# Patient Record
Sex: Female | Born: 1960 | Race: Black or African American | Hispanic: No | Marital: Single | Smoking: Never smoker
Health system: Southern US, Community
[De-identification: ages and names within clinical notes are randomized; demographics above are authoritative.]

## PROBLEM LIST (undated history)

## (undated) DIAGNOSIS — K5090 Crohn's disease, unspecified, without complications: Secondary | ICD-10-CM

## (undated) DIAGNOSIS — J329 Chronic sinusitis, unspecified: Secondary | ICD-10-CM

## (undated) DIAGNOSIS — E739 Lactose intolerance, unspecified: Secondary | ICD-10-CM

## (undated) HISTORY — PX: BREAST REDUCTION SURGERY: SHX8

## (undated) HISTORY — PX: ABDOMINAL HYSTERECTOMY: SHX81

## (undated) HISTORY — PX: BREAST SURGERY: SHX581

---

## 2000-11-20 ENCOUNTER — Emergency Department (HOSPITAL_COMMUNITY): Admission: EM | Admit: 2000-11-20 | Discharge: 2000-11-20 | Payer: Self-pay | Admitting: Emergency Medicine

## 2001-01-29 ENCOUNTER — Emergency Department (HOSPITAL_COMMUNITY): Admission: EM | Admit: 2001-01-29 | Discharge: 2001-01-29 | Payer: Self-pay | Admitting: *Deleted

## 2001-10-09 ENCOUNTER — Emergency Department (HOSPITAL_COMMUNITY): Admission: EM | Admit: 2001-10-09 | Discharge: 2001-10-09 | Payer: Self-pay | Admitting: *Deleted

## 2001-10-22 ENCOUNTER — Emergency Department (HOSPITAL_COMMUNITY): Admission: EM | Admit: 2001-10-22 | Discharge: 2001-10-22 | Payer: Self-pay | Admitting: Emergency Medicine

## 2002-02-23 ENCOUNTER — Inpatient Hospital Stay (HOSPITAL_COMMUNITY): Admission: EM | Admit: 2002-02-23 | Discharge: 2002-02-25 | Payer: Self-pay | Admitting: Emergency Medicine

## 2002-02-23 ENCOUNTER — Encounter: Payer: Self-pay | Admitting: Emergency Medicine

## 2002-09-16 ENCOUNTER — Emergency Department (HOSPITAL_COMMUNITY): Admission: EM | Admit: 2002-09-16 | Discharge: 2002-09-17 | Payer: Self-pay | Admitting: Emergency Medicine

## 2002-09-16 ENCOUNTER — Encounter: Payer: Self-pay | Admitting: Family Medicine

## 2002-09-20 ENCOUNTER — Encounter: Payer: Self-pay | Admitting: *Deleted

## 2002-09-20 ENCOUNTER — Inpatient Hospital Stay (HOSPITAL_COMMUNITY): Admission: EM | Admit: 2002-09-20 | Discharge: 2002-09-23 | Payer: Self-pay | Admitting: Emergency Medicine

## 2002-12-28 ENCOUNTER — Encounter: Payer: Self-pay | Admitting: Internal Medicine

## 2002-12-29 ENCOUNTER — Inpatient Hospital Stay (HOSPITAL_COMMUNITY): Admission: AD | Admit: 2002-12-29 | Discharge: 2002-12-31 | Payer: Self-pay | Admitting: Internal Medicine

## 2003-01-28 ENCOUNTER — Encounter (INDEPENDENT_AMBULATORY_CARE_PROVIDER_SITE_OTHER): Payer: Self-pay | Admitting: Specialist

## 2003-01-28 ENCOUNTER — Inpatient Hospital Stay (HOSPITAL_COMMUNITY): Admission: RE | Admit: 2003-01-28 | Discharge: 2003-02-05 | Payer: Self-pay | Admitting: Gastroenterology

## 2003-01-31 ENCOUNTER — Encounter (INDEPENDENT_AMBULATORY_CARE_PROVIDER_SITE_OTHER): Payer: Self-pay | Admitting: *Deleted

## 2005-01-24 ENCOUNTER — Emergency Department (HOSPITAL_COMMUNITY): Admission: EM | Admit: 2005-01-24 | Discharge: 2005-01-24 | Payer: Self-pay | Admitting: Emergency Medicine

## 2005-04-14 ENCOUNTER — Inpatient Hospital Stay (HOSPITAL_COMMUNITY): Admission: AD | Admit: 2005-04-14 | Discharge: 2005-04-14 | Payer: Self-pay | Admitting: Obstetrics and Gynecology

## 2005-05-02 ENCOUNTER — Ambulatory Visit: Payer: Self-pay | Admitting: Obstetrics and Gynecology

## 2005-05-11 ENCOUNTER — Inpatient Hospital Stay (HOSPITAL_COMMUNITY): Admission: AD | Admit: 2005-05-11 | Discharge: 2005-05-11 | Payer: Self-pay | Admitting: Obstetrics and Gynecology

## 2005-05-12 ENCOUNTER — Ambulatory Visit: Payer: Self-pay | Admitting: *Deleted

## 2005-05-12 ENCOUNTER — Observation Stay (HOSPITAL_COMMUNITY): Admission: AD | Admit: 2005-05-12 | Discharge: 2005-05-13 | Payer: Self-pay | Admitting: Obstetrics and Gynecology

## 2005-06-27 ENCOUNTER — Ambulatory Visit: Payer: Self-pay | Admitting: Obstetrics and Gynecology

## 2005-07-01 ENCOUNTER — Ambulatory Visit: Payer: Self-pay | Admitting: Obstetrics and Gynecology

## 2005-07-01 ENCOUNTER — Encounter (INDEPENDENT_AMBULATORY_CARE_PROVIDER_SITE_OTHER): Payer: Self-pay | Admitting: *Deleted

## 2005-07-01 ENCOUNTER — Ambulatory Visit (HOSPITAL_COMMUNITY): Admission: RE | Admit: 2005-07-01 | Discharge: 2005-07-01 | Payer: Self-pay | Admitting: Obstetrics and Gynecology

## 2005-07-02 ENCOUNTER — Inpatient Hospital Stay (HOSPITAL_COMMUNITY): Admission: AD | Admit: 2005-07-02 | Discharge: 2005-07-02 | Payer: Self-pay | Admitting: *Deleted

## 2005-07-11 ENCOUNTER — Ambulatory Visit: Payer: Self-pay | Admitting: Obstetrics and Gynecology

## 2005-08-02 ENCOUNTER — Ambulatory Visit: Payer: Self-pay | Admitting: *Deleted

## 2005-10-17 ENCOUNTER — Ambulatory Visit: Payer: Self-pay | Admitting: Obstetrics and Gynecology

## 2005-11-01 ENCOUNTER — Emergency Department (HOSPITAL_COMMUNITY): Admission: EM | Admit: 2005-11-01 | Discharge: 2005-11-01 | Payer: Self-pay | Admitting: Emergency Medicine

## 2006-01-23 ENCOUNTER — Ambulatory Visit: Payer: Self-pay | Admitting: Obstetrics and Gynecology

## 2006-05-01 ENCOUNTER — Ambulatory Visit: Payer: Self-pay | Admitting: Obstetrics and Gynecology

## 2006-05-13 ENCOUNTER — Ambulatory Visit: Payer: Self-pay | Admitting: Obstetrics and Gynecology

## 2006-05-13 ENCOUNTER — Inpatient Hospital Stay (HOSPITAL_COMMUNITY): Admission: RE | Admit: 2006-05-13 | Discharge: 2006-05-16 | Payer: Self-pay | Admitting: Obstetrics and Gynecology

## 2006-05-13 ENCOUNTER — Encounter (INDEPENDENT_AMBULATORY_CARE_PROVIDER_SITE_OTHER): Payer: Self-pay | Admitting: Specialist

## 2006-05-16 ENCOUNTER — Ambulatory Visit (HOSPITAL_COMMUNITY): Admission: RE | Admit: 2006-05-16 | Discharge: 2006-05-16 | Payer: Self-pay | Admitting: Obstetrics and Gynecology

## 2006-05-28 ENCOUNTER — Ambulatory Visit: Payer: Self-pay | Admitting: Obstetrics and Gynecology

## 2006-06-05 ENCOUNTER — Encounter: Admission: RE | Admit: 2006-06-05 | Discharge: 2006-06-05 | Payer: Self-pay | Admitting: Obstetrics and Gynecology

## 2006-06-10 ENCOUNTER — Encounter: Admission: RE | Admit: 2006-06-10 | Discharge: 2006-06-10 | Payer: Self-pay | Admitting: Obstetrics and Gynecology

## 2006-06-10 ENCOUNTER — Encounter (INDEPENDENT_AMBULATORY_CARE_PROVIDER_SITE_OTHER): Payer: Self-pay | Admitting: Specialist

## 2006-07-17 ENCOUNTER — Emergency Department (HOSPITAL_COMMUNITY): Admission: EM | Admit: 2006-07-17 | Discharge: 2006-07-17 | Payer: Self-pay | Admitting: Emergency Medicine

## 2006-08-14 ENCOUNTER — Encounter (INDEPENDENT_AMBULATORY_CARE_PROVIDER_SITE_OTHER): Payer: Self-pay | Admitting: *Deleted

## 2006-08-14 ENCOUNTER — Encounter: Admission: RE | Admit: 2006-08-14 | Discharge: 2006-08-14 | Payer: Self-pay | Admitting: General Surgery

## 2006-08-14 ENCOUNTER — Ambulatory Visit (HOSPITAL_COMMUNITY): Admission: RE | Admit: 2006-08-14 | Discharge: 2006-08-14 | Payer: Self-pay | Admitting: General Surgery

## 2007-02-19 ENCOUNTER — Emergency Department (HOSPITAL_COMMUNITY): Admission: EM | Admit: 2007-02-19 | Discharge: 2007-02-19 | Payer: Self-pay | Admitting: Emergency Medicine

## 2008-05-30 ENCOUNTER — Encounter (INDEPENDENT_AMBULATORY_CARE_PROVIDER_SITE_OTHER): Payer: Self-pay | Admitting: Emergency Medicine

## 2008-05-30 ENCOUNTER — Ambulatory Visit: Payer: Self-pay | Admitting: *Deleted

## 2008-05-30 ENCOUNTER — Emergency Department (HOSPITAL_COMMUNITY): Admission: EM | Admit: 2008-05-30 | Discharge: 2008-05-30 | Payer: Self-pay | Admitting: Emergency Medicine

## 2008-08-19 ENCOUNTER — Emergency Department (HOSPITAL_COMMUNITY): Admission: EM | Admit: 2008-08-19 | Discharge: 2008-08-20 | Payer: Self-pay | Admitting: Emergency Medicine

## 2009-05-22 ENCOUNTER — Encounter: Admission: RE | Admit: 2009-05-22 | Discharge: 2009-05-22 | Payer: Self-pay | Admitting: Obstetrics and Gynecology

## 2009-09-13 ENCOUNTER — Inpatient Hospital Stay (HOSPITAL_COMMUNITY): Admission: AD | Admit: 2009-09-13 | Discharge: 2009-09-13 | Payer: Self-pay | Admitting: Obstetrics & Gynecology

## 2009-09-21 ENCOUNTER — Encounter: Payer: Self-pay | Admitting: *Deleted

## 2009-09-21 ENCOUNTER — Ambulatory Visit: Payer: Self-pay | Admitting: Family Medicine

## 2009-09-21 DIAGNOSIS — J309 Allergic rhinitis, unspecified: Secondary | ICD-10-CM | POA: Insufficient documentation

## 2009-09-21 DIAGNOSIS — K5090 Crohn's disease, unspecified, without complications: Secondary | ICD-10-CM | POA: Insufficient documentation

## 2009-09-21 DIAGNOSIS — N644 Mastodynia: Secondary | ICD-10-CM | POA: Insufficient documentation

## 2009-09-25 ENCOUNTER — Telehealth: Payer: Self-pay | Admitting: *Deleted

## 2010-02-20 ENCOUNTER — Ambulatory Visit (HOSPITAL_BASED_OUTPATIENT_CLINIC_OR_DEPARTMENT_OTHER): Admission: RE | Admit: 2010-02-20 | Discharge: 2010-02-20 | Payer: Self-pay | Admitting: Plastic Surgery

## 2010-04-16 ENCOUNTER — Emergency Department (HOSPITAL_COMMUNITY): Admission: EM | Admit: 2010-04-16 | Discharge: 2010-04-16 | Payer: Self-pay | Admitting: Emergency Medicine

## 2010-06-01 ENCOUNTER — Encounter (INDEPENDENT_AMBULATORY_CARE_PROVIDER_SITE_OTHER): Payer: Self-pay | Admitting: Emergency Medicine

## 2010-06-01 ENCOUNTER — Emergency Department (HOSPITAL_COMMUNITY): Admission: EM | Admit: 2010-06-01 | Discharge: 2010-06-01 | Payer: Self-pay | Admitting: Emergency Medicine

## 2010-06-01 ENCOUNTER — Ambulatory Visit: Payer: Self-pay | Admitting: Vascular Surgery

## 2010-06-02 ENCOUNTER — Encounter: Admission: RE | Admit: 2010-06-02 | Discharge: 2010-06-02 | Payer: Self-pay | Admitting: Specialist

## 2010-07-24 ENCOUNTER — Observation Stay (HOSPITAL_COMMUNITY): Admission: EM | Admit: 2010-07-24 | Discharge: 2010-07-24 | Payer: Self-pay | Admitting: Emergency Medicine

## 2010-07-25 ENCOUNTER — Emergency Department (HOSPITAL_COMMUNITY): Admission: EM | Admit: 2010-07-25 | Discharge: 2010-07-25 | Payer: Self-pay | Admitting: Emergency Medicine

## 2010-07-26 ENCOUNTER — Telehealth: Payer: Self-pay | Admitting: Family Medicine

## 2010-07-26 ENCOUNTER — Ambulatory Visit: Payer: Self-pay | Admitting: Family Medicine

## 2010-07-26 DIAGNOSIS — T7840XA Allergy, unspecified, initial encounter: Secondary | ICD-10-CM | POA: Insufficient documentation

## 2010-12-23 ENCOUNTER — Encounter: Payer: Self-pay | Admitting: General Surgery

## 2011-01-03 NOTE — Assessment & Plan Note (Signed)
Summary: allergic reax per pt/Barber/spiegel   Vital Signs:  Patient profile:   50 year old female Weight:      167 pounds Temp:     99.2 degrees F oral Pulse rate:   63 / minute BP sitting:   127 / 82  (right arm)  Vitals Entered By: Renato Battles slade,cma CC: hives started on tuesday. pt on prednisone x 2 days. hives started again in face. eyes swollen/itching. Is Patient Diabetic? No Pain Assessment Patient in pain? no        Primary Care Provider:  Ellery Plunk MD  CC:  hives started on tuesday. pt on prednisone x 2 days. hives started again in face. eyes swollen/itching.Marland Kitchen  History of Present Illness: 50 y/o F here for allergic reaction that has not improved.  P was seen in  ER on 8/23 for facial edema, eyelid edema, lip edema and itchiness and trunkal hives.  She was given Prednisone 50mg  x 1 day, 40mg , 30mg , 20mg , 10mg .  She was seen in ER again on 8/24 for similar and was told that she has to give the medicine time to become effective.  Pt does not know what caused the allergic reaction. No new foods, meds, shampoo/detergent, lotion, make up, chemicals.  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  Went to work Sunday pm, her usual shift. On Mon Started noticing itchy nose, rhinorrhea, crusting eyes.  Then Tues swelling eyes, lips, face.  She remembers a similar event many years ago when she had watery eyes, rhinorrhea, hive, but never facial edema.   Breathing fine, sore throat now but not before. No dyspnea, chest pain, feeling that throat is closing on her, tongue edema, difficulty swallowing or speaking.  no nausea, vomiting.  Tmax 100 at home before she went to ER.    Habits & Providers  Alcohol-Tobacco-Diet     Tobacco Status: never  Current Medications (verified): 1)  Prednisone 10 Mg Tabs (Prednisone) .... Take 50mg   By Mouth Daily For Total of 5 Days. 2)  Hydroxyzine Hcl 25 Mg Tabs (Hydroxyzine Hcl) .Marland Kitchen.. 1 Tab By Mouth Every 6-8 Hours As Needed  For Itchiness  Allergies (verified): No Known Drug Allergies  Review of Systems       per hpi  Physical Exam  General:  Well-developed,well-nourished,in no acute distress; alert,appropriate and cooperative throughout examination Head:  Facial edema per pt Ears:  mild periorbital edema edema to lids Nose:  External nasal examination shows no deformity or inflammation. Nasal mucosa are pink and moist without lesions or exudates. Mouth:  Oral mucosa and oropharynx without lesions or exudates. lip edema Lungs:  Normal respiratory effort, chest expands symmetrically. Lungs are clear to auscultation, no crackles or wheezes. Heart:  Normal rate and regular rhythm. S1 and S2 normal without gallop, murmur, click, rub or other extra sounds. Skin:  Intact without suspicious lesions or rashes Cervical Nodes:  No lymphadenopathy noted Axillary Nodes:  No palpable lymphadenopathy   Impression & Recommendations:  Problem # 1:  ALLERGY UNSPECIFIED NOT ELSEWHERE CLASSIFIED (ICD-995.3) Assessment New Allergic reaction of unknown etiology.  50 does not appear in resp distress.  She was given a taper dose of prednisone (see hpi).  Today would be day 3 of 5.  Advised pt to take 50mg  instead of taper.  I will rx prednisone to give total of 50mg  for Fri and Sat.  Will give Hydroxyzine, since antihistamine may be more effective with allergic symptoms.  Advised about  sedation.  Red flags given to return to ER.  Orders: [facility]- Est Level  3 (81191)  Complete Medication List: 1)  Prednisone 10 Mg Tabs (Prednisone) .... Take 50mg   by mouth daily for total of 5 days. 2)  Hydroxyzine Hcl 25 Mg Tabs (Hydroxyzine hcl) .Marland Kitchen.. 1 tab by mouth every 6-8 hours as needed for itchiness  Patient Instructions: 1)  Continue taking Prednisone 50mg  daily for total of 5 days. 2)  For itching and swellling I've also prescribed Hydroxyzine for you.  You can take this 3-4 times per day.  This will cause sedation so do not drive  or operate heavy machinery with this medicine.   Prescriptions: HYDROXYZINE HCL 25 MG TABS (HYDROXYZINE HCL) 1 tab by mouth every 6-8 hours as needed for itchiness  #20 x 0   Entered and Authorized by:   Angeline Slim MD   Signed by:   Angeline Slim MD on 07/26/2010   Method used:   Electronically to        [facility]. [phone]* (retail)       [address]       [place], [place]  [address]       Ph: [phone]       Fax: [phone]   RxID:   [id number] PREDNISONE 10 MG TABS (PREDNISONE) Take 50mg   by mouth daily for total of 5 days.  #9 x 0   Entered and Authorized by:   Angeline Slim MD   Signed by:   Angeline Slim MD on 07/26/2010   Method used:   Electronically to        [facility]. [phone]* (retail)       [address]       [place], [place]  [address]       Ph: [phone]       Fax: [phone]   RxID:   [id number]

## 2011-01-03 NOTE — Progress Notes (Signed)
Summary: triage  Phone Note Call from Patient Call back at Home Phone [phone]   Caller: Patient Summary of Call: Would like to come in today for a allgeric reaction that she is having. Initial call taken by: Clydell Hakim,  July 26, 2010 8:35 AM  Follow-up for Phone Call        states eyes are swelling & face itches. started 3 days ago. went to ED Tuesday & put her on prednisone & benadryl. prednisone worked for a few hours but then she feels it is getting worse now. states she is almost here. told her to come & we will have the [place] md see her Follow-up by: Golden Circle RN,  July 26, 2010 8:37 AM

## 2011-01-18 ENCOUNTER — Encounter: Payer: Self-pay | Admitting: *Deleted

## 2011-02-22 LAB — POCT HEMOGLOBIN-HEMACUE: Hemoglobin: 11.9 g/dL — ABNORMAL LOW (ref 12.0–15.0)

## 2011-03-14 ENCOUNTER — Emergency Department (HOSPITAL_COMMUNITY)
Admission: EM | Admit: 2011-03-14 | Discharge: 2011-03-14 | Disposition: A | Discharge status: Home / Self Care | Payer: Managed Care, Other (non HMO) | Attending: Emergency Medicine | Admitting: Emergency Medicine

## 2011-03-14 DIAGNOSIS — R51 Headache: Secondary | ICD-10-CM | POA: Insufficient documentation

## 2011-03-14 DIAGNOSIS — J329 Chronic sinusitis, unspecified: Secondary | ICD-10-CM | POA: Insufficient documentation

## 2011-03-14 DIAGNOSIS — J3489 Other specified disorders of nose and nasal sinuses: Secondary | ICD-10-CM | POA: Insufficient documentation

## 2011-03-14 DIAGNOSIS — R059 Cough, unspecified: Secondary | ICD-10-CM | POA: Insufficient documentation

## 2011-03-14 DIAGNOSIS — R05 Cough: Secondary | ICD-10-CM | POA: Insufficient documentation

## 2011-03-14 DIAGNOSIS — R6889 Other general symptoms and signs: Secondary | ICD-10-CM | POA: Insufficient documentation

## 2011-03-14 DIAGNOSIS — R070 Pain in throat: Secondary | ICD-10-CM | POA: Insufficient documentation

## 2011-04-19 NOTE — Discharge Summary (Signed)
NAME:  Stacy Andrews, Stacy Andrews NO.:  [id number]   MEDICAL RECORD NO.:  [id number]                   PATIENT TYPE:  INP   LOCATION:  5707                                 FACILITY:  [facility]   PHYSICIAN:  Danise Edge, M.D.                DATE OF BIRTH:  1961-08-22   DATE OF ADMISSION:  09/20/2002  DATE OF DISCHARGE:  09/23/2002                                 DISCHARGE SUMMARY   DISCHARGE DIAGNOSIS:  Crohn's colitis.   DISCHARGE MEDICATIONS:  1. Prednisone 40 mg each morning for two weeks, 35 mg each morning for one     week, 30 mg each morning for one week, 25 mg each morning for one week,     20 mg each morning for one week, 15 mg each morning for one week, and     finally 10 mg each morning for one week.  2. Vicodin two q.6h. p.r.n. (#100).   OFFICE FOLLOW-UP:  I will plan to see the patient back in my office in  approximately two to three weeks.   LABORATORY DATA:  White blood cell count 5100; hemoglobin 11.6 g; platelet  count 454,000.  Urinalysis unremarkable.  Complete metabolic profile normal  except for a serum albumin 2.7.  CT scan of the abdomen and pelvis was  consistent with left-sided colitis without abscess formation.   HOSPITAL COURSE:  The patient is a 50 year old female born January 21, 1961.  In 1985 she was diagnosed with Crohn's ileitis complicated by iritis  and episcleritis.   The patient was admitted to [facility] in March 2003 to treat  Crohn's colitis diagnosed by abdominal CT scan.  She achieved a symptomatic  remission on Asacol, Cipro, and Flagyl.   On September 16, 2002 the patient was evaluated in the [facility] emergency  room with nausea, vomiting, and left flank pain.  Her complete metabolic  profile was normal except for a serum albumin 2.7.  Her CBC was normal.  She  was prescribed metronidazole, Phenergan, and Vicodin.   The patient returned to the [facility] emergency room on September 20, 2002  with  predominantly generalized pelvic pain, nausea, and vomiting to the  point that she was unable to keep down her Flagyl.   The patient was admitted to the hospital to manage recurrent Crohn's colitis  demonstrated by repeat CT scan performed in the [facility] emergency room.  She was placed on intravenous Solu-Medrol.  She rapidly improved  symptomatically.  Her nausea and vomiting have resolved.  She continues to  have mild pelvic pain and low back pain.  She remains afebrile.  She is in  stable medical condition to be discharged on oral prednisone.   The patient has not established with a gynecologist in town as yet but plans  to do so.  I will see her back in my office in follow up in about three  weeks.                                               Danise Edge, M.D.    MJ/MEDQ  D:  09/23/2002  T:  09/23/2002  Job:  [id number]

## 2011-04-19 NOTE — H&P (Signed)
NAME:  Stacy Andrews, Stacy Andrews NO.:  [id number]   MEDICAL RECORD NO.:  [id number]                   PATIENT TYPE:  OBV   LOCATION:  2854                                 FACILITY:  [facility]   PHYSICIAN:  Danise Edge, M.D.                DATE OF BIRTH:  06-16-1961   DATE OF ADMISSION:  01/28/2003  DATE OF DISCHARGE:                                HISTORY & PHYSICAL   PROBLEMS:  1. Crohn's ileocolitis.  2. Distal colonic stricture.  3. SULFA ALLERGY.   HISTORY:  Ms. Stacy Andrews is a 50 year old female, born January 21, 1961.  Ms. Stacy Andrews was diagnosed with Crohn's ileitis which was complicated  by the development of iritis and episcleritis at the [facility] in 1985.  CT scan of the abdomen and pelvis performed March 2003 and  January 2004 showed colitis involving the splenic flexure and descending  colon.  Ms. Stacy Andrews is allergic to SULFA and cannot tolerate a sulfadine.  March 1997, abdominal ultrasound was normal.  March 1997 pelvic ultrasound  and March 2003 pelvic CT scan showed a 2 cm x 2 cm right ovary cyst.   Ms. Stacy Andrews continues to experience predominantly left-sided abdominal pain  which responds to a combination of Flagyl and Cipro but recurs after therapy  is discontinued.  She has had at least one course of prednisone which did  not give her long lasting pain relief.   MEDICATION ALLERGIES:  SULFA.   CHRONIC MEDICATIONS:  Vicodin p.r.n. pain.   PAST MEDICAL HISTORY:  1. In 1985, Crohn's ileitis complicated by iritis and episcleritis diagnosed     at the [employer].  2. Tubal ligation.  3. Blood transfusions for low hemoglobin.  4. March 2003, abdominal-pelvic CT scan revealed descending colon colitis     and a 2 cm x 2 cm right ovary cyst.  5. Iron deficiency anemia.  6. March 1997, abdominal ultrasound normal.  7. January 2004, CT scan of the abdomen and pelvis revealed colitis     involving  the splenic flexure and descending colon.  8. March 1997, pelvic ultrasound revealed a 1.7 cm x 2 cm right ovarian     cyst.   FAMILY HISTORY:  Brother has Crohn's disease.   HABITS:  Ms. Stacy Andrews does not smoke cigarettes and consumes alcohol in  moderation.   PHYSICAL EXAMINATION:  HEIGHT:  5 feet 2.5 inches.  WEIGHT:  152 pounds.  GENERAL APPEARANCE:  Ms. Stacy Andrews appears healthy and is complaining of right-  sided abdominal pain.  HEENT:  Sclerae nonicteric.  LUNGS:  Clear to auscultation.  CARDIAC:  Regular rhythm without murmurs.  ABDOMEN:  Soft and flat with right-sided abdominal discomfort.  EXTREMITIES:  No edema.   FLEXIBLE PROCTOSIGMOIDOSCOPY REPORT:  Yesterday, Ms. Stacy Andrews was placed on a  clear liquid diet and received a colonic lavage prep, consisting of 255  grams  of Miralax and 64 ounces of Gator-Ade.  She received 10 mg Versed and  100 mg Demerol prior to her scheduled colonoscopy.  Her blood pressure,  oxygen saturation, and cardiac rhythm were monitored throughout the  procedure and documented in the medical record.   Anal inspection was normal.  Digital rectal examination was normal.  The  Olympus pediatric colonoscope was introduced into the rectum and advanced to  approximately 55 cm from the anal verge, at which point a colonic stricture  with associated eroded mucosa was encountered.  I was unable to traverse  this stricture with the pediatric colonoscope, and a complete colonoscopy  was not performed.  Biopsies from the stricture were taken.  Mucosa of the  rectum and colon up to the stricture was normal.   ASSESSMENT:  1. Chronic Crohn's ileocolitis.  2. 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  3. SULFA allergy.   PLAN:  1. Consult surgery.  2. Start oral Cipro and Flagyl.                                               Danise Edge, M.D.    MJ/MEDQ  D:  01/28/2003  T:  01/28/2003   Job:  [id number]

## 2011-04-19 NOTE — Op Note (Signed)
NAMEFARRAN, AMSDEN             ACCOUNT NO.:  [id number]   MEDICAL RECORD NO.:  [id number]          PATIENT TYPE:  WOC   LOCATION:  WOC                          FACILITY:  [facility]   PHYSICIAN:  Phil D. Okey Dupre, M.D.     DATE OF BIRTH:  12-10-1960   DATE OF PROCEDURE:  05/14/2005  DATE OF DISCHARGE:  05/02/2005                                 OPERATIVE REPORT   PREOPERATIVE DIAGNOSIS:  Dysfunctional uterine bleeding with fibroid uterus.   POSTOPERATIVE DIAGNOSIS:  Dysfunctional uterine bleeding with fibroid  uterus, inability to identify the cervix.   OPERATION PERFORMED:  Attempted dilation and curettage and examination under  anesthesia.   SURGEON:  Javier Glazier. Okey Dupre, M.D.   ANESTHESIA:  General.   POSTOPERATIVE CONDITION:  Satisfactory.   DESCRIPTION OF PROCEDURE:  Under satisfactory general anesthesia, the  patient in dorsal lithotomy position, perineum and vagina prepped and draped  in the usual sterile manner.  Bimanual pelvic examination under anesthesia  revealed a uterus of approximately 24 weeks' size up to the umbilicus and a  large leiomyomata pushing into the cul-de-sac of the vagina so that the  cervix was pushed anterior and was apparently right under the symphysis. It  felt like it was dilated if it was the cervix that I was feeling but no  aborting fibroid.  There was a large fibroid on the posterior wall of what I  think was the cervix, pushing anteriorly but not separated from the  posterior lip of the cervix.  Using weighted speculum, retractors and Graves  speculum, I tried to visualize the cervix and was unable to.  In spite of  the bleeding, I could see it was coming from above but I could not actually  visualize where it was coming from and several attempts at sounding failed,  so the ultrasound could not show the endometrial cavity prior to this  procedure, so after several attempts, the procedure was aborted.  There was  a small amount of bleeding.  The  patient was transferred to recovery in  satisfactory condition with a blood loss of about 100 cc.       PDR/MEDQ  D:  05/14/2005  T:  05/14/2005  Job:  [id number]

## 2011-04-19 NOTE — Op Note (Signed)
Stacy Andrews, Stacy Andrews             ACCOUNT NO.:  [id number]   MEDICAL RECORD NO.:  [id number]          PATIENT TYPE:  AMB   LOCATION:  SDS                          FACILITY:  [facility]   PHYSICIAN:  Cherylynn Ridges, M.D.    DATE OF BIRTH:  17-Dec-1960   DATE OF PROCEDURE:  08/14/2006  DATE OF DISCHARGE:  08/14/2006                                 OPERATIVE REPORT   PREOPERATIVE DIAGNOSES:  Mammographic lesion and calcifications of the right  breast.   POSTOPERATIVE DIAGNOSES:  Mammographic lesion and calcifications of the  right breast.   PROCEDURE:  Wire localization and right breast biopsy.   SURGEON:  Cherylynn Ridges, M.D.   ANESTHESIA:  General with a laryngeal airway.   ESTIMATED BLOOD LOSS:  Less than 20 cc.   COMPLICATIONS:  None.   CONDITION:  Stable.   INDICATIONS FOR OPERATION:  The patient is a 50 year old with a mammographic  lesion and calcifications of her right breast, who comes in for a wire  localization biopsy.   FINDINGS:  The patient had an external wire localization with the wire which  went through the area of calcification and was marked.  Post excision, we  sent it to radiology, where the calcifications and the marker were confirmed  to be in the specimen.   OPERATION:  The patient was taken to the operating room, placed on the table  in supine position.  After an adequate general laryngeal airway anesthetic  was administered, she was prepped and draped in the usual sterile manner,  exposing the right breast.   The wire had been cut and the breast had been prepped adequately.  We made a  counterincision approximately 3 to 4 cm from the entrance site of the wire,  which came in sort of inferolaterally.  We dissected down into the breast  substance using a #15 blade, making sure not to cut the wire  inappropriately.  We were able to dissect down to the area, which appeared  to be almost midway between the intraparenchymal course of the wire, and  removed  the tissue using a #15 blade.  Once the tissue was removed, it was  sent to radiology, where they confirmed the calcifications and marker.   Hemostasis was obtained with electrocautery, and we then closed in 2 layers  of 3-0 Vicryl in the deep layer and 5-0 Vicryl to close the skin in a  subcuticular manner.  Marcaine 0.5% with epi was injected in the skin.  All  counts were correct.      Cherylynn Ridges, M.D.  Electronically Signed     JOW/MEDQ  D:  08/14/2006  T:  08/14/2006  Job:  [id number]

## 2011-04-19 NOTE — Op Note (Signed)
Stacy Andrews, Stacy Andrews             ACCOUNT NO.:  [id number]   MEDICAL RECORD NO.:  [id number]          PATIENT TYPE:  INP   LOCATION:  9313                          [facility]   PHYSICIAN:  Phil D. Okey Dupre, M.D.     DATE OF BIRTH:  07-Sep-1961   DATE OF PROCEDURE:  05/13/2006  DATE OF DISCHARGE:                                 OPERATIVE REPORT   PROCEDURE:  Total abdominal hysterectomy.   PREOPERATIVE DIAGNOSIS:  Symptomatic fibroids.   POSTOPERATIVE DIAGNOSIS:  Symptomatic fibroids.   ESTIMATED BLOOD LOSS:  600 mL.   SURGEON:  Javier Glazier. Okey Dupre, M.D.   FIRST ASSISTANT:  Ginger Carne, M.D.   ANESTHESIA:  General.   SPECIMENS TO PATHOLOGY:  Uterus.   POSTOPERATIVE CONDITION:  Satisfactory.   OPERATIVE FINDINGS:  Less than a year ago, the patient was scheduled for  hysterectomy because of symptomatic fibroids of the uterus that measured  more than 16 cm in diameter with multiple leiomyomata.  She was treated with  Lupron. The patient, however, cancelled surgery on several different  occasions and so Lupron was re-given to the patient within the last few  months. During this procedure, we found that the uterus was shrunken down to  the size of about an 8-week gestation, there was a large posterior fibroid  that was responsible for the enlargement.  Both ovaries were found to be  normal.  There were minimal adhesions, one of the bowel to the anterior  abdominal wall.   DESCRIPTION OF PROCEDURE:  Under satisfactory general anesthesia, the  patient in the dorsal supine position and a Foley catheter in the urinary  bladder, the abdomen was prepped and draped in the usual sterile manner.  I  entered through a transverse skin incision situated 3 cm above the symphysis  pubis and running for a total length of 18 cm. The abdomen was then opened  by layers in the typical Maylard fashion.  There was much oozing on entry  which we controlled by hot cautery and 3-0 chromic ties. On  entering the  peritoneal cavity, the upper abdominal viscera was explored and found to be  within normal limits with exception of the adhesion aforementioned.  Attention was directed to the pelvis where clamps were placed lateral to the  uterus and just adjacent to it across the fallopian tube mesosalpinx and  round ligament.  The round ligaments were then ligated with #1 chromic  catgut suture ligature, divided, the anterior leaf of broad ligaments opened  parallel to the uterus extending around to the anterior superior portion of  cervix where the bladder was pushed away the anterior surface of cervix.  An  opening was made in the avascular portions of the broad ligament through  which a free tie of #1 chromic was placed and tied medial to the ovaries on  either side, clamps placed medial to the aforementioned tie, the tissue  medially divided and the lateral pedicle ligated with #1 chromic catgut  suture ligatures, all sutures were cut short.  The uterine vessels were  skeletonized, doubly clamped, divided and ligated #1  chromic catgut suture  ligature.  Because of the fundal fibroid, there was a significant amount of  oozing and several clamps had to be used to control that at the uterines.  The cardinal ligaments were clamped, divided and ligated with #1 chromic  catgut suture ligature.  The bladder was pushed well away from the anterior  surface of the cervix and the fundus of the uterus was attempted to be  dissected away from the cervix.  By doing this we entered the vagina which  made the dissection even easier.  We dissected the cervix away from the  vagina and the uterus was handed intact to the scrub nurse to be sent to  pathology.  Angled sutures of #1 chromic were placed into the lateral  vaginal cuff angles and the cuff was closed with interrupted figure-of-eight  #1 chromic catgut sutures.  The area was observed for bleeding and none was  noted. At this point, the pelvis was  irrigated.  The bowel that was attached  to the anterior abdominal wall was dissected away by sharp dissection and a  small defect in the serosa of the small bowel was adjusted with several  interrupted 3-0 black silk suture.  The tape, instrument, and sponge counts  were then reported as correct and the pelvis irrigated. The abdominal  peritoneum was closed with continuous running 2-0 chromic catgut suture.  The fascia was closed with continuous running 0 Vicryl suture.  Subcutaneous  bleeders were once again searched for and none were noted and the  subcutaneous tissue was irrigated with normal saline.  The skin edge was  approximated with skin staples.  Dry, sterile dressing was applied.  The  patient tolerated the procedure well and was transferred to the recovery  room in satisfactory condition with approximately 600 mL blood loss and the  Foley catheter draining clear amber urine at the end of the procedure.           ______________________________  Javier Glazier. Okey Dupre, M.D.     PDR/MEDQ  D:  05/13/2006  T:  05/13/2006  Job:  [id number]

## 2011-04-19 NOTE — Group Therapy Note (Signed)
Stacy Andrews, Stacy Andrews NO.:  [id number]   MEDICAL RECORD NO.:  [id number]          PATIENT TYPE:  WOC   LOCATION:  [facility]                   FACILITY:  [facility]   PHYSICIAN:  Argentina Donovan, MD        DATE OF BIRTH:  [birth year]   DATE OF SERVICE:  05/02/2005                                    CLINIC NOTE   HISTORY OF PRESENT ILLNESS:  The patient is a 50 year old gravida 5, para 4-  0-1-4 who was seen in the MAU 2 weeks ago with extreme pelvic pain and heavy  clotting and bleeding.  The patient, since the birth of her last child 15  years ago, has had increased pelvic pain, but lately, it has become  intolerable, and it has affected her ability even to work.  In addition to  this, the bleeding has gotten a lot heavier, and she has passage of enormous  clots.  She is mildly anemic with a hematocrit of 32.0.   PAST MEDICAL HISTORY:  Her past history includes 15 years of Crohn's disease  for which in 2004 she had a bowel resection.   PHYSICAL EXAMINATION:  PELVIC:  She has a uterus that extends up to the  umbilicus, and measures, on ultrasound, more than 16.0 cm in diameter.  The  ultrasound picture gives the impression of adenomyosis, and on examination,  external genitalia was normal.  BUS within normal limits.  The vagina is  clean and well rugated.  The cervix is clean, slightly eroded, and a Pap  smear was taken.  Bimanual pelvic examination was done revealing nothing  with the exception of the above description of the abdominal mass.  The  adnexa obviously could not be palpated.  VITAL SIGNS:  She is 5 feet 3 inches, and weighs 194 pounds.  Her blood  pressure is 127/77.  Her pulse is 69.  Temperature 99.4.   DISCUSSION:  The patient is on no medication for her Crohn's disease at the  present time, but does keep in contact with her gastroenterologist.  The  only medication she takes recently is for pain during her period for which  she had been on tramadol and  Vicodin.  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   IMPRESSION:  1.  Symptomatic leiomyomata uteri with adenomyosis.  2.  Secondary anemia.  3.  Disabling dysmenorrhea.   PLAN:  Total abdominal hysterectomy.  We discussed possible complications as  well as followup after surgery and a limited time for any heavy lifting.  We  also talked about the value of retaining her ovaries.  She is going to make  a  decision on that in the future, and we will examine the patient one week  before surgery, and schedule it tentatively for August of 2006.      PR/MEDQ  D:  05/02/2005  T:  05/02/2005  Job:  [id number]

## 2011-04-19 NOTE — Consult Note (Signed)
NAME:  Stacy Andrews, Stacy Andrews NO.:  [id number]   MEDICAL RECORD NO.:  [id number]                   PATIENT TYPE:  OBV   LOCATION:  2854                                 FACILITY:  [facility]   PHYSICIAN:  Jimmye Norman III, M.D.               DATE OF BIRTH:  03-27-1961   DATE OF CONSULTATION:  01/28/2003  DATE OF DISCHARGE:                                   CONSULTATION   Dear Dr. Laural Benes:   Thank you very much for asking me to see this patient, a 50 year old female  who has been plagued with Crohn's ileitis and Crohn's disease since 1989, I  believe, initially diagnosed at the young age who now comes in with colonic  stricture likely secondary to Crohn's disease.   The patient has had multiple admissions over the last year, starting back in  March with admission for left-sided abdominal pain.  She subsequently had  admissions in October and then in January 2004 with the same complaint of  abdominal pain with CT scan demonstrating colitis in the splenic flexure to  proximal mid descending colon area.  She subsequently has been discharged  and treated with oral Flagyl and ciprofloxacin which has resolved her  problems, but it always comes back once her antibiotics are discontinued.   She was brought in today by Dr. Laural Benes for colonoscopy, and he ran into a  stricture at about 55 cm, and a surgical consultation was obtained.  The  patient had been able to tolerate a bowel prep for this procedure; however,  over the last two months, she says she has had persistent diarrhea likely  secondary to stricturing.   PAST MEDICAL HISTORY:  1. Crohn's disease diagnosed initially in 1985, complicated by iritis and     scleritis.  2. Chronic anemia likely secondary to chronic Crohn's disease.   PAST SURGICAL HISTORY:  She has had a tubal ligation done in the past, I  believe done laparoscopically.   CURRENT MEDICATIONS:  Include Asacol, Vicodin, ciprofloxacin, and  Flagyl.   FAMILY HISTORY:  She has a brother who has Crohn's disease also.   SOCIAL HISTORY:  She does not smoke, only takes alcohol in moderation.   PHYSICAL EXAMINATION:  VITAL SIGNS:  She is 5 feet 2-1/2 inches tall, weight  152 pounds.  GENERAL:  She is currently not complaining of any pain.  HEENT:  Normocephalic and atraumatic.  Anicteric.  NECK:  Supple.  CHEST:  Clear to auscultation.  ABDOMEN:  Soft with mild tenderness in the right side and left side of the  upper abdomen.  RECTAL:  Not performed.  She had a colonoscopy and flexible sigmoidoscopy  today which showed a stricture about 55 cm from the anal verge.  Biopsies  were taken.   ALLERGIES:  SULFA medications.   IMPRESSION:  Chronic Crohn's stricture of the left colon with pain likely  secondary to that, but the  possibility of a neoplasm cannot be ruled out  based on the CT scan and colonoscopy.  Biopsies have been sent.  She will be  admitted and started on ciprofloxacin and Flagyl again; however, this time  we will go ahead and take advantage of the current bowel prep and perhaps  perform a colectomy with primary anastomosis.  She is not on steroids and  has not been.                                               Kathrin Ruddy, M.D.    JW/MEDQ  D:  01/28/2003  T:  01/28/2003  Job:  [id number]   cc:   Danise Edge, M.D.  [address]  [place]  [place] [address]  Fax: [phone]

## 2011-04-19 NOTE — Discharge Summary (Signed)
   [name], SCHNACKENBERG NO.:  [id number]   MEDICAL RECORD NO.:  [id number]                   PATIENT TYPE:  INP   LOCATION:  5707                                 FACILITY:  [facility]   PHYSICIAN:  Danise Edge, M.D.                DATE OF BIRTH:  September 17, 1961   DATE OF ADMISSION:  09/20/2002  DATE OF DISCHARGE:  09/23/2002                                 DISCHARGE SUMMARY   DISCHARGE DIAGNOSES:  1. Crohn's ileocolitis by abdominal CT scan.  2. A 2 cm x 2 cm right ovarian cyst.   DISCHARGE MEDICATIONS:  1. Prednisone 40 mg each morning along with a taper.  2. Vicodin #100 for abdominal pain.   LABORATORY DATA:  CT scan of the abdomen and pelvis is consistent with  descending colitis and a 2.5 cm cystic lesion in the right ovary.  White  blood cell count 5100, hemoglobin 11.6, platelet normal.  Urinalysis reveals  six white blood cells per high-powered field.   HOSPITAL COURSE:  The patient is a 50 year old female born 07/31/2061.  In  1985, the patient was diagnosed with Crohn's ileitis complicated by iritis  and episcleritis.  In 3/03, the patient was admitted to [facility]  to treat Crohn's colitis.  Remission was achieved with Asacol, Cipro and  Flagyl.   On 09/16/02, the patient was evaluated in the [facility] emergency  room with nausea, vomiting and left flank pain.  Her complete metabolic  profile was normal except for a serum albumin 2.7.  CBC was normal.  The  patient was prescribed Flagyl and discharged from the emergency room.   On 09/20/02, the patient returned to the [facility] emergency room with  predominantly generalized pelvic pain, nausea and vomiting.  She completed  her menses a week prior to admission.  Her complete metabolic profile and  CBC were normal.  CT scan for the abdomen and pelvis is consistent with left  ascitic colitis.   The patient was placed on IV Solu-Medrol and intravenous morphine.  Her  pain,  nausea and vomiting rapidly resolved.  Within 72 hours, she was  tolerating a regular diet and was discharged on oral prednisone in stable  medical condition to be followed up by me as an outpatient.                                               Danise Edge, M.D.    MJ/MEDQ  D:  12/14/2002  T:  12/14/2002  Job:  [id number]

## 2011-04-19 NOTE — Group Therapy Note (Signed)
NAMETEIGHAN, AUBERT NO.:  [id number]   MEDICAL RECORD NO.:  [id number]          PATIENT TYPE:  WOC   LOCATION:  [facility]                   [facility]ACILITY:  [facility]   PHYSICIAN:  Argentina Donovan, MD        DATE OF BIRTH:  1961-05-23   DATE OF SERVICE:                                    CLINIC NOTE   HISTORY OF PRESENT ILLNESS:  The patient's H&P is on the chart.  She is a 50-  year-old lady with huge uterine fibroids who was scheduled in 3 days for a  hysterectomy.  She is canceling because of personal reasons concerning her  mother, and cannot have the surgery until December so we around to proceed  with a second dose of Depo-Lupron, and the patient will call me to remind me  to schedule her in November, and we will go ahead and schedule her for  surgery in December, and see the patient again prior to surgery.       PR/MEDQ  D:  07/11/2005  T:  07/11/2005  Job:  [id number]

## 2011-04-19 NOTE — Discharge Summary (Signed)
   NAME:  Stacy Andrews, Stacy Andrews NO.:  [id number]   MEDICAL RECORD NO.:  [id number]                   PATIENT TYPE:  INP   LOCATION:  5741                                 FACILITY:  [facility]   PHYSICIAN:  Jimmye Norman III, M.D.               DATE OF BIRTH:  [birth year]   DATE OF ADMISSION:  01/28/2003  DATE OF DISCHARGE:  02/05/2003                                 DISCHARGE SUMMARY   DISCHARGE DIAGNOSIS:  Colonic stricture secondary to Crohn's disease.   PRINCIPAL PROCEDURES:  1. Colonoscopy.  2. Left colectomy with primary anastomosis.   SURGEON:  Jimmye Norman, M.D.   DISCHARGE MEDICATIONS:  She was discharged home on Pentasa as prescribed by  Dr. Luther Parody and Percocet p.r.n. for pain.   DIET:  Regular.   CONDITION ON DISCHARGE:  Stable.   SUMMARY OF HOSPITALIZATION:  I was asked to see the patient in consultation  approximately 8 hours after she was admitted after colonoscopy demonstrated  a proximal descending colon stricture.  She underwent a bowel prep over the  next 24-48 hours and then on the first of March underwent a colectomy and  colonoscopy from which she did very well.  By postop day #5, she was able to  go home, tolerating her diet well.  She had been started back on her Pentasa  after pathology demonstrated that this was indeed Crohn's disease with a  Crohn's stricture.  She was to follow up to see me in 2 weeks after  discharge.                                               Kathrin Ruddy, M.D.    JW/MEDQ  D:  04/28/2003  T:  04/28/2003  Job:  [id number]

## 2011-04-19 NOTE — Discharge Summary (Signed)
NAMEMICHALENE, DEBRULER             ACCOUNT NO.:  [id number]   MEDICAL RECORD NO.:  [id number]          PATIENT TYPE:  INP   LOCATION:  9320                          [facility]   PHYSICIAN:  Phil D. Okey Dupre, M.D.     DATE OF BIRTH:  Apr 03, 1961   DATE OF ADMISSION:  05/12/2005  DATE OF DISCHARGE:  05/13/2005                                 DISCHARGE SUMMARY   The patient is a 50 year old black female who was seen in the clinic and  given Depo Lupron in order to shrink down enormous fibroids up to the  umbilicus about 24 weeks size.  That was done on the beginning of the month.  She began having bleeding probably secondary to the estrogen surge several  days before her admission.  The hemoglobin had dropped down to about 6 and  she had unstable orthostatics when she came in.  She was transfused 2 units  of packed cells and since we were unable to do an endometrial biopsy in the  office I took her an attempted to do a D&C but that was also a failure  because of the cervix being pushed forward up against the symphysis and  behind it.  The bleeding had abated to just staining and I am hoping this is  a sign that the Depo Lupron is working.  She is going to be discharged today  and followed in the clinic in two weeks in order to see if we can buy time  until August to do the surgery and hopefully have a significant drop in the  size of the uterus.  However, the patient has been told if the bleeding  starts up again and she has to be admitted we would probably have to  transfuse her up before surgery if she was not able to build that hemoglobin  up on the iron alone and we would schedule her as an emergency for  hysterectomy.   DISCHARGE DIAGNOSES:  Dysfunctional uterine bleeding secondary to large  fibroid uterus and Depo Lupron.   PLAN:  Eventual hysterectomy.       PDR/MEDQ  D:  05/14/2005  T:  05/14/2005  Job:  [id number]

## 2011-04-19 NOTE — Op Note (Signed)
Stacy Andrews, Stacy Andrews             ACCOUNT NO.:  [id number]   MEDICAL RECORD NO.:  [id number]          PATIENT TYPE:  AMB   LOCATION:  SDC                           [facility]   PHYSICIAN:  Phil D. Okey Dupre, M.D.     DATE OF BIRTH:  1961-09-01   DATE OF PROCEDURE:  07/01/2005  DATE OF DISCHARGE:                                 OPERATIVE REPORT   PROCEDURE:  Examination under anesthesia, pelvic examination under  anesthesia, dilatation and curettage, and biopsy of cervical mass.   PREOPERATIVE DIAGNOSIS:  Uterine fibroids, probable aborting fibroid.   POSTOPERATIVE DIAGNOSIS:  Uterine fibroids, cervical fibroid.   SURGEON:  Javier Glazier. Okey Dupre, M.D.   ANESTHESIA:  General.   ESTIMATED BLOOD LOSS:  50 mL.   POSTOPERATIVE CONDITION:  Satisfactory.   REASON FOR PROCEDURE:  This 50 year old black female was brought into the  hospital in June of 2006 for [facility] which was unable to be done because of the  placement of the cervix which was directly behind and barely palpable behind  the symphysis pubis.  The patient at that time had palpable leiomyomata up  above the umbilicus.  She was put on Depo-Lupron and when we saw her a few  days ago in the clinic, the uterus had shrunken down to the size of about a  16-week pregnancy.  At this point, the cervix could be easily visualized and  looked as if she was having an aborting fibroid, although, I could not  really feel posteriorly around the mass in the cervix and decided we would  take her in and try and do a D&C again in case the patient opted for uterine  artery embolization.   DESCRIPTION OF PROCEDURE:  The patient was placed in the dorsal lithotomy  position.  The perineum and vagina were prepped and draped in the usual  sterile fashion.  Bimanual pelvic examination revealed the uterus about 16  weeks size, markedly irregular in configuration and a mass at the entrance  to the cervix.  A weighted speculum was placed at the posterior  fourchette  of the vagina.  The cervix looked like it was dilated 5 cm.  The anterior  lip was easily separated from the mass that was posterior, but attached to  the lip and obviously a large fibroid that we could palpate extended up into  the abdominal cavity posteriorly.  The cervix was then sounded to a depth of  13 cm anterior to this mass and was dilated enough I could get a serrated  curet in and curetted the lining of the uterus which was large and quite  regular in configuration.  This was sent separately.  The biopsy was taken  of the cervical mass and  the suture placed in that mass to control the bleeding from the biopsy site  with a 2-0 chromic catgut suture.  The patient was then transferred to  recovery room in satisfactory condition after the speculum was removed from  the vagina.       PDR/MEDQ  D:  07/01/2005  T:  07/01/2005  Job:  [id number]

## 2011-04-19 NOTE — Group Therapy Note (Signed)
NAMECECE, MILHOUSE NO.:  [id number]   MEDICAL RECORD NO.:  [id number]          PATIENT TYPE:  WOC   LOCATION:  [facility]                   [facility]ACILITY[facility]   PHYSICIAN:  Argentina Donovan, MD        DATE OF BIRTH:  [birth year]   DATE OF SERVICE:  05/01/2006                                    CLINIC NOTE   CHIEF COMPLAINT:  Large abdominal swelling and very heavy periods.   HISTORY OF PRESENT ILLNESS:  The patient is a 50 year old African-American  female, gravida 5, para 4-0-1-4, who has enormous uterine fibroids that  extended up high above the umbilicus, and it has now been able to be  shrunken down to just below the umbilicus with Depo-Lupron that the patient  has been on for six months.  She is scheduled for a total abdominal  hysterectomy.  This was previously scheduled and the patient cancelled one  year ago.  She had an endometrial biopsy which was non-significant in its  pathology, as well as a cervical biopsy showing only chronic cervicitis.  We  have discussed with the patient the procedure and the possible  complications, especially those related to anesthesia, bleeding, infection,  injury to bowel or urinary tract.  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   ALLERGIES:  The patient's allergies are just seasonal, no medication  allergies.   MEDICATIONS:  She is on no medication at the present time.   FAMILY HISTORY:  No history of cancer or diabetes.  Some hypertension in  various members of her family.   REVIEW OF SYSTEMS:  Fairly benign with the exception of present illness  which is causing her a lot of pelvic pain.   PHYSICAL EXAMINATION:  GENERAL:  A well-developed, well-nourished, somewhat  obese black female in no acute distress.  VITAL SIGNS:  Weigh 201 pounds, height 5 feet 4 inches tall, blood pressure  136/100, pulse 73 per minute, temperature  98.8.  HEENT:  Within normal limits.  NECK:  Supple with no thyroid masses.  No adenopathy noted.  LUNGS:  Clear to auscultation and percussion.  BREASTS:  Symmetrical with no dominant masses, no nipple discharge.  HEART:  No murmur, normal sinus rhythm.  ABDOMEN:  Soft, flat, with a palpable mass just below the umbilicus and a  large surgical scar that extends from her sternum down to the suprapubic  area from her previous surgery.  No other pain or guarding is noted.  No CVA  tenderness.  PELVIC:  External genitalia is normal.  BUS is normal.  Vagina is clean and  well rugated.  Cervix clean and parous.  The uterus is afore mentioned  approximately this size of an 18 to 20 week pregnancy.  EXTREMITIES:  Negative with no sign of edema or varices.  DTR's within  normal limits.   IMPRESSION:  The patient in good health with the exception of obesity.  Judged for total abdominal hysterectomy because of symptomatic fibroids.  A  bowel prep is then given  to the patient because of her previous surgery.           ______________________________  Argentina Donovan, MD     PR/MEDQ  D:  05/01/2006  T:  05/01/2006  Job:  [id number]   cc:   Dr. Okey Dupre GYN office at [facility]  [phone]

## 2011-04-19 NOTE — H&P (Signed)
NAME:  Stacy Andrews, Stacy Andrews NO.:  [id number]   MEDICAL RECORD NO.:  [id number]                   PATIENT TYPE:  EMS   LOCATION:  MAJO                                 FACILITY:  [facility]   PHYSICIAN:  Danise Edge, M.D.                DATE OF BIRTH:  04/03/1961   DATE OF ADMISSION:  09/20/2002  DATE OF DISCHARGE:                                HISTORY & PHYSICAL   ADMISSION PROBLEM:  Crohn's colitis.   HISTORY OF PRESENT ILLNESS:  The patient is a 50 year-old female born on  Apr 19, 1961.  In 1985 the patient was diagnosed with Crohn's ileitis  complicated by iritis and episcleritis.   The patient was admitted to [facility] in March 2003 to treat  Crohn's colitis by an abdominal CT scan.  The patient achieved a symptomatic  remission with the combination of Asacol, Cipro and Flagyl.   On September 16, 2002 the patient was evaluated in the [facility]  emergency room with nausea, vomiting and left flank pain.  Her complete  metabolic profile was normal except for a serum albumin of 2.7.  Her CBC was  normal.  She was prescribed Flagyl, Phenergan and Vicodin.   The patient returns to the [facility] emergency room on September 20, 2002 with predominantly generalized pelvic pain with nausea and vomiting.  She has not been able to keep down her Flagyl.  She completed a normal  menses last week. She denies gastrointestinal bleeding.  She has been  running fevers at home.  In the [facility] emergency room her  complete metabolic profile and CBC are normal except for a serum albumin of  2.7.  Her CT scan of the abdomen and pelvis was consistent with left sided  colitis without abscess formation.   MEDICATION ALLERGIES:  SULFA.   MEDICATIONS ON ADMISSION TO THE EMERGENCY ROOM INCLUDE:  1. Flagyl.  2. Phenergan.  3. Vicodin.   PAST MEDICAL HISTORY:  1. Crohn's disease diagnosed in 1985 and complicated by iritis and  episcleritis.  2. Tubal ligation.  3. Blood transfusions for low hemoglobin.  4. Abdominal ultrasound in March 1992 which was normal.   HABITS:  The patient does not smoke cigarettes and consumes alcohol in  moderation.   FAMILY HISTORY:  The patient's brother has Crohn's disease.   PHYSICAL EXAMINATION:  GENERAL APPEARANCE:  The patient is alert and appears  comfortable lying on her stretcher in the emergency room.  HEENT:  Sclerae are non-icteric.  LUNGS:  Clear to auscultation.  CARDIAC:  Regular rhythm without murmurs.  ABDOMEN:  Soft, flat and non-tender.  EXTREMITIES:  No edema.   ASSESSMENT:  Crohn's colitis.  Danise Edge, M.D.    MJ/MEDQ  D:  09/20/2002  T:  09/20/2002  Job:  [id number]

## 2011-04-19 NOTE — Op Note (Signed)
NAME:  RISSIE, SCULLEY NO.:  [id number]   MEDICAL RECORD NO.:  [id number]                   PATIENT TYPE:  OBV   LOCATION:  5533                                 FACILITY:  [facility]   PHYSICIAN:  Jimmye Norman III, M.D.               DATE OF BIRTH:  [birth year]   DATE OF PROCEDURE:  01/31/2003  DATE OF DISCHARGE:                                 OPERATIVE REPORT   PREOPERATIVE DIAGNOSIS:  Crohn's disease with left colonic stricture.   POSTOPERATIVE DIAGNOSIS:  Crohn's disease with left colonic stricture, with  stricture at the splenic flexure and with an inflammatory mass.   PROCEDURE:  Partial left colectomy involving the distal transverse colon,  the splenic flexure, and the proximal descending colon.   SURGEON:  Jimmye Norman, M.D.   ASSISTANT:  Currie Paris, M.D.   ANESTHESIA:  General endotracheal.   ESTIMATED BLOOD LOSS:  Less than 50 mL.   COMPLICATIONS:  None.   CONDITION:  Stable.   INDICATION FOR PROCEDURE:  The patient is a 50 year old female who was  diagnosed with Crohn's disease over 15 years ago, who has over the past year  had recurrent bouts of inflammatory colitis of the left colon documented by  CT scan.  Most recently she underwent a colonoscopy by Danise Edge, M.D.,  which demonstrated a stricture at approximately 55 cm, and a surgical  consultation was obtained.   FINDINGS:  The patient had an inflammatory mass at the splenic flexure  extending to the proximal descending colon and the distal transverse colon.  There was no actual palpable mass outside of the inflammatory mass.  The  liver, spleen, gallbladder, and stomach all appeared to be normal.  There  was no other evidence of inflammatory bowel disease throughout the small  bowel or the large bowel.   DESCRIPTION OF PROCEDURE:  The patient was taken to the operating room and  placed on the table in supine position.  After an adequate endotracheal  anesthetic  was administered, she was prepped and draped in the usual sterile  manner, exposing the midline of the abdomen.   The initial incision was made approximately 3-4 cm above the umbilicus down  to below the umbilicus, curving to the left.  We extended it proximally  because once we were in the peritoneal cavity, we could palpate that the  tumor was at the splenic flexure.  We took it down to and through the  midline fascia using electrocautery.  Once we had done so, we placed the  patient in reversed Trendelenburg.  The left colon, descending colon, and  distal transverse colon were mobilized using Metzenbaum scissors,  electrocautery, and blunt dissection to mobilize the colon away from the  left paracolic wall on the splenic flexure and the spleen and the stomach.  Care was taken to take it down from the spleen, and it was done so without  injury  to the spleen whatsoever.  We mobilized the left colon at the line of  Toldt and brought it more toward the midline.  We also mobilized the  transverse colon by resecting part of the omentum along with the distal  transverse colon.  Once we had freed up enough distal colon and we were at  the mid- to proximal descending colon, a  GIA-75 stapler with 3.5 mm closure  staples was passed across the mid- to proximal descending colon.  The  mesentery in between that and the splenic flexure was taken down between  [place] clamps and 2-0 silk ties.  We also came across the distal transverse  colon using the GIA-75 stapler.  We took part of the omentum and also the  mesentery to the left of the middle colic vessel using Kelly clamps and 2-0  silk ties.  Once we had completely resected all these, we were able to  remove the specimen and send it off the field.   A side-to-side functional end-to-end anastomosis was made between the two  ends of the colon using the GIA-75 stapler with the TA-60 stapler being used  to close the resulting enterotomy.  The  mesentery was closed using  interrupted 3-0 Vicryl pop-off sutures.  Care was taken not to compromise  the vasculature during the closure of the mesentery.  We irrigated with warm  saline solution.  An NG tube was put in place and palpated to be in an  adequate position.  Once this was done, we closed the abdomen after removing  all lap tapes.   The fascia was closed using a running #1 PDS suture, and the skin was closed  using stainless steel staples.  All needle counts, sponge counts, and  instrument counts were correct at conclusion of the case.  The patient was  taken to the recovery room in stable condition with a sterile dressing  applied.                                                Kathrin Ruddy, M.D.    JW/MEDQ  D:  01/31/2003  T:  01/31/2003  Job:  [id number]

## 2011-04-19 NOTE — Discharge Summary (Signed)
Stacy Andrews, Stacy Andrews             ACCOUNT NO.:  [id number]   MEDICAL RECORD NO.:  [id number]          PATIENT TYPE:  INP   LOCATION:  9309                          FACILITY:  [facility]   PHYSICIAN:  Phil D. Rose, M.D.     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  The patient has done  extremely well postoperatively.  She has been completely afebrile during her  entire course.  Her hemoglobin has been stable since the first postoperative  day when it had dropped due to the fact that the patient had a bowel prep  and was somewhat dehydrated at the time blood work was done just before  surgery.  In addition to hysterectomy, in taking down some adhesions, there  was a slight defect made in the serosa of the small bowel, which was  repaired.  The patient has had no side effects on any of this.  She is  passing gas,  voiding well and is going to have her staples removed before  discharge.  Her abdomen is soft, flat, nontender with normal appearing  healing, no sign of any induration or infection.  Her lungs are clear to  auscultation and percussion.  Heart was normal sinus rhythm, no murmur with  a pulse of approximately 68 at the time of discharge.  No CVA tenderness.  Extremities are normal.  The patient has been giving detailed instructions  as to follow-up in approximately 2 weeks in the GYN clinic.  Activity as far  as driving, stairs, lifting and recommendations on diet.  We are going to  discharge her on Slow Fe but cautioned her not to  start it until  she is  having regular bowel movements.  She has asked for Fleet's enema before  discharge, which will be given and Percocet 5 mg, #40 for pain.  We have  also encouraged her to take ibuprofen at the same time until she is finished  with the Percocet as it may make the necessity for her Percocet less  necessary.   IMPRESSION:  Satisfactory recovery following total abdominal hysterectomy.           ______________________________  Javier Glazier Okey Dupre, M.D.     PDR/MEDQ  D:  05/16/2006  T:  05/16/2006  Job:  [id number]

## 2011-04-19 NOTE — Discharge Summary (Signed)
NAME:  Stacy Andrews, Stacy Andrews NO.:  [id number]   MEDICAL RECORD NO.:  [id number]                   PATIENT TYPE:  INP   LOCATION:  5733                                 FACILITY:  [facility]   PHYSICIAN:  Stacie Glaze, M.D. [facility]           DATE OF BIRTH:  [birth year]   DATE OF ADMISSION:  12/28/2002  DATE OF DISCHARGE:  12/30/2002                                 DISCHARGE SUMMARY   DISCHARGE DIAGNOSES:  1. Abdominal pain.  2. Exacerbation of Crohn's disease.  3. Low grade fever.  4. Hypokalemia.   BRIEF HISTORY AND PHYSICAL:  Ms. Winne is a 50 year old African American  female with a one week history of abdominal pain, low grade fever and  nausea.  She states symptoms have been worse over the past 48 hours.  She  was unable to keep any fluids down.   PAST MEDICAL HISTORY:  1. Crohn's disease diagnosed in 1998, last admitted in October, 2003.  2. Ovarian cyst.   HOSPITAL COURSE:  1. GI.  We suspect a Crohn's exacerbation.  A CT of the abdomen was obtained     and she had marked thickening and inflammation of the proximal descending     colon and splenic flexure consistent with inflammatory bowel disease and     or diverticulitis.  This is more pronounced compared with a film from     March, 2003.  CT of the pelvis was unremarkable.  Dr. Laural Benes did pay a     courtesy visit.  He recommended follow up GI consultation to re-stage     Crohn's disease.  He felt this could be done as an outpatient.  He felt     she would need a colonoscopy, distal ileoscopy plus or minus radiologic     barium, small bowel follow through.  The patient is agreeable to     outpatient follow up with Dr. Laural Benes.  Currently the patient's pain is     tolerable with narcotic pain medicine and she is tolerating a regular     diet.  2. The patient presented with low grade fever.  She was empirically started     on Cipro and Flagyl.  Will have her complete a full 10 day course.   LABORATORY DATA:  At discharge:  Potassium was 3, she did receive oral  replacement.  Hemoglobin 10.9, hematocrit 33, platelet count 403.   DISCHARGE MEDICATIONS:  1. Cipro 500 mg b.i.d. for seven days.  2.     Flagyl 500 mg b.i.d. for seven days.  3. Tylenol #30 every four to six hours p.r.n.   FOLLOW UP:  With Dr. Laural Benes in three to four weeks and Dr. Drue Novel in one to  two weeks.     Cornell Barman, P.A. [facility]                  Stacie Glaze, M.D. [facility]  LC/MEDQ  D:  12/31/2002  T:  12/31/2002  Job:  [id number]   cc:   Danise Edge, M.D.  [address]  [place]  [place] [address]  Fax: [phone]

## 2011-06-14 ENCOUNTER — Emergency Department (HOSPITAL_COMMUNITY)
Admission: EM | Admit: 2011-06-14 | Discharge: 2011-06-14 | Disposition: A | Discharge status: Home / Self Care | Payer: Managed Care, Other (non HMO) | Attending: Emergency Medicine | Admitting: Emergency Medicine

## 2011-06-14 DIAGNOSIS — K5090 Crohn's disease, unspecified, without complications: Secondary | ICD-10-CM | POA: Insufficient documentation

## 2011-06-14 DIAGNOSIS — R110 Nausea: Secondary | ICD-10-CM | POA: Insufficient documentation

## 2011-06-14 DIAGNOSIS — L03211 Cellulitis of face: Secondary | ICD-10-CM | POA: Insufficient documentation

## 2011-06-14 DIAGNOSIS — L0201 Cutaneous abscess of face: Secondary | ICD-10-CM | POA: Insufficient documentation

## 2011-06-14 DIAGNOSIS — R05 Cough: Secondary | ICD-10-CM | POA: Insufficient documentation

## 2011-06-14 DIAGNOSIS — R220 Localized swelling, mass and lump, head: Secondary | ICD-10-CM | POA: Insufficient documentation

## 2011-06-14 DIAGNOSIS — J3489 Other specified disorders of nose and nasal sinuses: Secondary | ICD-10-CM | POA: Insufficient documentation

## 2011-06-14 DIAGNOSIS — R51 Headache: Secondary | ICD-10-CM | POA: Insufficient documentation

## 2011-06-14 DIAGNOSIS — R221 Localized swelling, mass and lump, neck: Secondary | ICD-10-CM | POA: Insufficient documentation

## 2011-06-14 DIAGNOSIS — R509 Fever, unspecified: Secondary | ICD-10-CM | POA: Insufficient documentation

## 2011-06-14 DIAGNOSIS — R059 Cough, unspecified: Secondary | ICD-10-CM | POA: Insufficient documentation

## 2011-07-01 ENCOUNTER — Emergency Department (HOSPITAL_COMMUNITY)
Admission: EM | Admit: 2011-07-01 | Discharge: 2011-07-01 | Disposition: A | Discharge status: Home / Self Care | Payer: Managed Care, Other (non HMO) | Attending: Emergency Medicine | Admitting: Emergency Medicine

## 2011-07-01 DIAGNOSIS — R0982 Postnasal drip: Secondary | ICD-10-CM | POA: Insufficient documentation

## 2011-07-01 DIAGNOSIS — J329 Chronic sinusitis, unspecified: Secondary | ICD-10-CM | POA: Insufficient documentation

## 2011-07-01 DIAGNOSIS — R110 Nausea: Secondary | ICD-10-CM | POA: Insufficient documentation

## 2011-07-01 DIAGNOSIS — J3489 Other specified disorders of nose and nasal sinuses: Secondary | ICD-10-CM | POA: Insufficient documentation

## 2011-07-01 DIAGNOSIS — R51 Headache: Secondary | ICD-10-CM | POA: Insufficient documentation

## 2011-08-20 ENCOUNTER — Emergency Department (HOSPITAL_COMMUNITY): Payer: Self-pay

## 2011-08-20 ENCOUNTER — Emergency Department (HOSPITAL_COMMUNITY)
Admission: EM | Admit: 2011-08-20 | Discharge: 2011-08-20 | Disposition: A | Discharge status: Home / Self Care | Payer: Self-pay | Attending: Emergency Medicine | Admitting: Emergency Medicine

## 2011-08-20 ENCOUNTER — Encounter (HOSPITAL_COMMUNITY): Payer: Self-pay | Admitting: Radiology

## 2011-08-20 DIAGNOSIS — J329 Chronic sinusitis, unspecified: Secondary | ICD-10-CM | POA: Insufficient documentation

## 2011-08-20 DIAGNOSIS — R0982 Postnasal drip: Secondary | ICD-10-CM | POA: Insufficient documentation

## 2011-08-20 DIAGNOSIS — R070 Pain in throat: Secondary | ICD-10-CM | POA: Insufficient documentation

## 2011-08-20 DIAGNOSIS — R51 Headache: Secondary | ICD-10-CM | POA: Insufficient documentation

## 2011-08-20 DIAGNOSIS — R059 Cough, unspecified: Secondary | ICD-10-CM | POA: Insufficient documentation

## 2011-08-20 DIAGNOSIS — J3489 Other specified disorders of nose and nasal sinuses: Secondary | ICD-10-CM | POA: Insufficient documentation

## 2011-08-20 DIAGNOSIS — R509 Fever, unspecified: Secondary | ICD-10-CM | POA: Insufficient documentation

## 2011-08-20 DIAGNOSIS — R05 Cough: Secondary | ICD-10-CM | POA: Insufficient documentation

## 2011-08-20 HISTORY — DX: Crohn's disease, unspecified, without complications: K50.90

## 2011-08-20 HISTORY — DX: Chronic sinusitis, unspecified: J32.9

## 2011-08-29 LAB — D-DIMER, QUANTITATIVE: D-Dimer, Quant: 0.49 — ABNORMAL HIGH

## 2011-09-17 ENCOUNTER — Emergency Department (HOSPITAL_COMMUNITY)
Admission: EM | Admit: 2011-09-17 | Discharge: 2011-09-17 | Disposition: A | Discharge status: Home / Self Care | Payer: Self-pay | Attending: Emergency Medicine | Admitting: Emergency Medicine

## 2011-09-17 DIAGNOSIS — J3489 Other specified disorders of nose and nasal sinuses: Secondary | ICD-10-CM | POA: Insufficient documentation

## 2011-09-17 DIAGNOSIS — R6889 Other general symptoms and signs: Secondary | ICD-10-CM | POA: Insufficient documentation

## 2011-09-17 DIAGNOSIS — R070 Pain in throat: Secondary | ICD-10-CM | POA: Insufficient documentation

## 2011-09-17 DIAGNOSIS — K5090 Crohn's disease, unspecified, without complications: Secondary | ICD-10-CM | POA: Insufficient documentation

## 2011-09-17 DIAGNOSIS — J329 Chronic sinusitis, unspecified: Secondary | ICD-10-CM | POA: Insufficient documentation

## 2012-11-23 ENCOUNTER — Encounter (HOSPITAL_BASED_OUTPATIENT_CLINIC_OR_DEPARTMENT_OTHER): Payer: Self-pay | Admitting: Emergency Medicine

## 2012-11-23 ENCOUNTER — Emergency Department (HOSPITAL_BASED_OUTPATIENT_CLINIC_OR_DEPARTMENT_OTHER)
Admission: EM | Admit: 2012-11-23 | Discharge: 2012-11-23 | Disposition: A | Discharge status: Home / Self Care | Payer: Self-pay | Attending: Emergency Medicine | Admitting: Emergency Medicine

## 2012-11-23 DIAGNOSIS — R42 Dizziness and giddiness: Secondary | ICD-10-CM | POA: Insufficient documentation

## 2012-11-23 DIAGNOSIS — R05 Cough: Secondary | ICD-10-CM | POA: Insufficient documentation

## 2012-11-23 DIAGNOSIS — J329 Chronic sinusitis, unspecified: Secondary | ICD-10-CM

## 2012-11-23 DIAGNOSIS — J0110 Acute frontal sinusitis, unspecified: Secondary | ICD-10-CM | POA: Insufficient documentation

## 2012-11-23 DIAGNOSIS — K5090 Crohn's disease, unspecified, without complications: Secondary | ICD-10-CM | POA: Insufficient documentation

## 2012-11-23 DIAGNOSIS — J0100 Acute maxillary sinusitis, unspecified: Secondary | ICD-10-CM | POA: Insufficient documentation

## 2012-11-23 DIAGNOSIS — Z8709 Personal history of other diseases of the respiratory system: Secondary | ICD-10-CM | POA: Insufficient documentation

## 2012-11-23 DIAGNOSIS — R059 Cough, unspecified: Secondary | ICD-10-CM | POA: Insufficient documentation

## 2012-11-23 MED ORDER — ONDANSETRON HCL 4 MG PO TABS: 4.0000 mg | ORAL_TABLET | Freq: Four times a day (QID) | ORAL | Status: DC

## 2012-11-23 MED ORDER — PHENYLEPHRINE HCL 0.5 % NA SOLN
1.0000 [drp] | Freq: Once | NASAL | Status: AC
  Administered 2012-11-23: 1 [drp] via NASAL
  Filled 2012-11-23: qty 15

## 2012-11-23 MED ORDER — OXYCODONE-ACETAMINOPHEN 5-325 MG PO TABS
2.0000 | ORAL_TABLET | Freq: Once | ORAL | Status: AC
  Administered 2012-11-23: 2 via ORAL
  Filled 2012-11-23 (×2): qty 2

## 2012-11-23 MED ORDER — AZITHROMYCIN 250 MG PO TABS: 250.0000 mg | ORAL_TABLET | Freq: Every day | ORAL | Status: DC

## 2012-11-23 MED ORDER — AZITHROMYCIN 250 MG PO TABS
500.0000 mg | ORAL_TABLET | Freq: Once | ORAL | Status: AC
  Administered 2012-11-23: 500 mg via ORAL
  Filled 2012-11-23: qty 2

## 2012-11-23 MED ORDER — OXYCODONE-ACETAMINOPHEN 5-325 MG PO TABS: 1.0000 | ORAL_TABLET | ORAL | Status: DC | PRN

## 2012-11-23 NOTE — ED Provider Notes (Signed)
History     CSN: [id number]  Arrival date & time 11/23/12  7829   First MD Initiated Contact with Patient 11/23/12 1025      Chief Complaint  Patient presents with  . Nausea  . Cough  . Dizziness    (Consider location/radiation/quality/duration/timing/severity/associated sxs/prior treatment) HPI  Patient with facial pain, congestion, hot and cold began this a.m.Marland Kitchen  Pressure in face like prior sinusitis.  Last sinusitis 2010, no recent antiiotics.  Patient without vomiting, diarrhea.  Patient with nausea, and feels dizzy when she stands up.  No symptoms prior to today.  Patient states pain bilateral upper back increases with gaggin.    Past Medical History  Diagnosis Date  . Crohn's disease   . Sinusitis     Past Surgical History  Procedure Date  . Abdominal hysterectomy     No family history on file.  History  Substance Use Topics  . Smoking status: Not on file  . Smokeless tobacco: Not on file  . Alcohol Use:     OB History    Grav Para Term Preterm Abortions TAB SAB Ect Mult Living                  Review of Systems  Constitutional: Negative for fever, chills, activity change, appetite change and unexpected weight change.  HENT: Positive for congestion. Negative for sore throat, rhinorrhea, neck pain, neck stiffness and sinus pressure.   Eyes: Negative for visual disturbance.  Respiratory: Negative for cough and shortness of breath.   Cardiovascular: Negative for chest pain and leg swelling.  Gastrointestinal: Negative for vomiting, abdominal pain, diarrhea and blood in stool.  Genitourinary: Negative for dysuria, urgency, frequency, vaginal discharge and difficulty urinating.  Musculoskeletal: Negative for myalgias, arthralgias and gait problem.  Skin: Negative for color change and rash.  Neurological: Negative for weakness, light-headedness and headaches.  Hematological: Does not bruise/bleed easily.  Psychiatric/Behavioral: Negative for dysphoric mood.     Allergies  Sulfa antibiotics  Home Medications  No current outpatient prescriptions on file.  BP 108/68  Pulse 70  Temp 99.1 F (37.3 C) (Oral)  Resp 14  SpO2 100%  Physical Exam  Nursing note and vitals reviewed. Constitutional: She is oriented to person, place, and time. She appears well-developed and well-nourished.  HENT:  Head: Normocephalic and atraumatic.  Right Ear: External ear normal.  Left Ear: External ear normal.  Nose: Nose normal.  Mouth/Throat: Oropharynx is clear and moist.       Facial tenderness over bilateral frontal and maxillary sinuses.    Eyes: Conjunctivae normal and EOM are normal. Pupils are equal, round, and reactive to light.  Neck: Normal range of motion. Neck supple.  Cardiovascular: Normal rate, regular rhythm, normal heart sounds and intact distal pulses.   Pulmonary/Chest: Effort normal and breath sounds normal.  Abdominal: Soft. Bowel sounds are normal.  Musculoskeletal: Normal range of motion.  Neurological: She is alert and oriented to person, place, and time. She has normal reflexes.  Skin: Skin is warm and dry.  Psychiatric: She has a normal mood and affect. Her behavior is normal. Judgment and thought content normal.    ED Course  Procedures (including critical care time)  Labs Reviewed - No data to display No results found.   No diagnosis found.    Patient states symptoms like prior sinusitis.  Patient with tenderness over facial sinuses.  Plan decongestant, antiemetics, and zithromax.         Hilario Quarry, MD 11/28/12  1253 

## 2012-11-23 NOTE — ED Notes (Signed)
Per EMS:  Pt c/o nausea, fever, chills, cough since 8 am.  No active vomiting.

## 2013-02-02 ENCOUNTER — Emergency Department (HOSPITAL_BASED_OUTPATIENT_CLINIC_OR_DEPARTMENT_OTHER)
Admission: EM | Admit: 2013-02-02 | Discharge: 2013-02-02 | Disposition: A | Discharge status: Home / Self Care | Payer: BC Managed Care – PPO | Attending: Emergency Medicine | Admitting: Emergency Medicine

## 2013-02-02 ENCOUNTER — Encounter (HOSPITAL_BASED_OUTPATIENT_CLINIC_OR_DEPARTMENT_OTHER): Payer: Self-pay | Admitting: *Deleted

## 2013-02-02 DIAGNOSIS — Z8719 Personal history of other diseases of the digestive system: Secondary | ICD-10-CM | POA: Insufficient documentation

## 2013-02-02 DIAGNOSIS — J069 Acute upper respiratory infection, unspecified: Secondary | ICD-10-CM | POA: Insufficient documentation

## 2013-02-02 DIAGNOSIS — J329 Chronic sinusitis, unspecified: Secondary | ICD-10-CM | POA: Insufficient documentation

## 2013-02-02 MED ORDER — AMOXICILLIN-POT CLAVULANATE 875-125 MG PO TABS: 1.0000 | ORAL_TABLET | Freq: Two times a day (BID) | ORAL | Status: DC

## 2013-02-02 MED ORDER — DIPHENHYDRAMINE HCL 50 MG/ML IJ SOLN
25.0000 mg | Freq: Once | INTRAMUSCULAR | Status: AC
  Administered 2013-02-02: 25 mg via INTRAMUSCULAR
  Filled 2013-02-02: qty 1

## 2013-02-02 MED ORDER — METOCLOPRAMIDE HCL 5 MG/ML IJ SOLN
10.0000 mg | Freq: Once | INTRAMUSCULAR | Status: AC
  Administered 2013-02-02: 10 mg via INTRAMUSCULAR
  Filled 2013-02-02: qty 2

## 2013-02-02 MED ORDER — KETOROLAC TROMETHAMINE 30 MG/ML IJ SOLN
60.0000 mg | Freq: Once | INTRAMUSCULAR | Status: AC
  Administered 2013-02-02: 60 mg via INTRAMUSCULAR
  Filled 2013-02-02 (×2): qty 1

## 2013-02-02 NOTE — ED Notes (Signed)
Patient states she developed swelling in the left face yesterday, which she has had before and is associated with sinusitis.  States the swelling resolved this morning and returned today around 1000 am and is now associated with migraine headache and dizziness and a low grade temp of 99 early this morning.

## 2013-02-02 NOTE — ED Provider Notes (Signed)
History     CSN: [id number]  Arrival date & time 02/02/13  1117   First MD Initiated Contact with Patient 02/02/13 1153      Chief Complaint  Patient presents with  . URI    (Consider location/radiation/quality/duration/timing/severity/associated sxs/prior treatment) HPI Comments: Pt states that pressure has lead to her having a migraine  Patient is a 52 y.o. female presenting with URI. The history is provided by the patient. No language interpreter was used.  URI Presenting symptoms: congestion   Severity:  Moderate Onset quality:  Unable to specify Timing:  Constant Progression:  Unchanged Relieved by:  Nothing Worsened by:  Nothing tried Ineffective treatments:  None tried Associated symptoms: sinus pain     Past Medical History  Diagnosis Date  . Crohn's disease   . Sinusitis     Past Surgical History  Procedure Laterality Date  . Abdominal hysterectomy    . Breast reduction surgery      No family history on file.  History  Substance Use Topics  . Smoking status: Never Smoker   . Smokeless tobacco: Not on file  . Alcohol Use: No    OB History   Grav Para Term Preterm Abortions TAB SAB Ect Mult Living                  Review of Systems  Constitutional: Negative.   HENT: Positive for congestion.   Respiratory: Negative.   Cardiovascular: Negative.     Allergies  Sulfa antibiotics  Home Medications   Current Outpatient Rx  Name  Route  Sig  Dispense  Refill  . amoxicillin-clavulanate (AUGMENTIN) 875-125 MG per tablet   Oral   Take 1 tablet by mouth every 12 (twelve) hours.   20 tablet   0   . azithromycin (ZITHROMAX Z-PAK) 250 MG tablet   Oral   Take 1 tablet (250 mg total) by mouth daily.   4 tablet   0   . ondansetron (ZOFRAN) 4 MG tablet   Oral   Take 1 tablet (4 mg total) by mouth every 6 (six) hours.   12 tablet   0   . oxyCODONE-acetaminophen (PERCOCET/ROXICET) 5-325 MG per tablet   Oral   Take 1 tablet by mouth every 4  (four) hours as needed for pain.   6 tablet   0     BP 145/86  Pulse 69  Temp(Src) 98.4 F (36.9 C) (Oral)  Resp 18  SpO2 100%  Physical Exam  Vitals reviewed. Constitutional: She is oriented to person, place, and time. She appears well-developed and well-nourished.  HENT:  Right Ear: External ear normal.  Left Ear: External ear normal.  Nose: Mucosal edema present. Left sinus exhibits frontal sinus tenderness.  Eyes: Conjunctivae and EOM are normal.  Neck: Normal range of motion. Neck supple.  Cardiovascular: Normal rate and regular rhythm.   Pulmonary/Chest: Effort normal and breath sounds normal.  Musculoskeletal: Normal range of motion.  Neurological: She is alert and oriented to person, place, and time.  Skin: Skin is warm and dry.  Psychiatric: She has a normal mood and affect.    ED Course  Procedures (including critical care time)  Labs Reviewed - No data to display No results found.   1. Sinusitis       MDM  Will treat with antibiotics        Teressa Lower, NP 02/02/13 1259  Teressa Lower, NP 02/02/13 1300

## 2013-02-02 NOTE — ED Provider Notes (Signed)
History/physical exam/procedure(s) were performed by non-physician practitioner and as supervising physician I was immediately available for consultation/collaboration. I have reviewed all notes and am in agreement with care and plan.   Hilario Quarry, MD 02/02/13 [phone]

## 2013-10-09 ENCOUNTER — Encounter (HOSPITAL_COMMUNITY): Payer: Self-pay | Admitting: Emergency Medicine

## 2013-10-09 ENCOUNTER — Emergency Department (HOSPITAL_COMMUNITY)
Admission: EM | Admit: 2013-10-09 | Discharge: 2013-10-10 | Disposition: A | Discharge status: Home / Self Care | Payer: BC Managed Care – PPO | Attending: Emergency Medicine | Admitting: Emergency Medicine

## 2013-10-09 DIAGNOSIS — Z8719 Personal history of other diseases of the digestive system: Secondary | ICD-10-CM | POA: Insufficient documentation

## 2013-10-09 DIAGNOSIS — J020 Streptococcal pharyngitis: Secondary | ICD-10-CM | POA: Insufficient documentation

## 2013-10-09 DIAGNOSIS — Z792 Long term (current) use of antibiotics: Secondary | ICD-10-CM | POA: Insufficient documentation

## 2013-10-09 DIAGNOSIS — R5381 Other malaise: Secondary | ICD-10-CM | POA: Insufficient documentation

## 2013-10-09 HISTORY — DX: Lactose intolerance, unspecified: E73.9

## 2013-10-09 NOTE — ED Notes (Signed)
Pt. reports sore throat for 1 week with occasional dry cough and runny nose .

## 2013-10-10 LAB — RAPID STREP SCREEN (MED CTR MEBANE ONLY): Streptococcus, Group A Screen (Direct): POSITIVE — AB

## 2013-10-10 MED ORDER — DIPHENHYDRAMINE HCL 12.5 MG/5ML PO ELIX
25.0000 mg | ORAL_SOLUTION | Freq: Once | ORAL | Status: AC
  Administered 2013-10-10: 25 mg via ORAL
  Filled 2013-10-10: qty 10

## 2013-10-10 MED ORDER — AMOXICILLIN 500 MG PO CAPS: 500.0000 mg | ORAL_CAPSULE | Freq: Three times a day (TID) | ORAL | Status: DC

## 2013-10-10 MED ORDER — LIDOCAINE VISCOUS 2 % MT SOLN
15.0000 mL | Freq: Once | OROMUCOSAL | Status: AC
  Administered 2013-10-10: 15 mL via OROMUCOSAL
  Filled 2013-10-10: qty 15

## 2013-10-10 MED ORDER — NAPROXEN 500 MG PO TABS: 500.0000 mg | ORAL_TABLET | Freq: Two times a day (BID) | ORAL | Status: DC

## 2013-10-10 MED ORDER — HYDROCOD POLST-CHLORPHEN POLST 10-8 MG/5ML PO LQCR
5.0000 mL | Freq: Once | ORAL | Status: AC
  Administered 2013-10-10: 5 mL via ORAL
  Filled 2013-10-10: qty 5

## 2013-10-10 NOTE — ED Provider Notes (Signed)
CSN: [id number]     Arrival date & time 10/09/13  2342 History   First MD Initiated Contact with Patient 10/09/13 2356     Chief Complaint  Patient presents with  . Sore Throat   HPI  History provided by the patient. Patient is a 52 year old female with history of Crohn's disease presenting with complaints of continued sore throat symptoms for the last week. She describes moderate occasionally severe sore throat worse with swallowing. She has had some associated rhinorrhea and very occasional dry cough. She denies any fever but has some chills and increased fatigue. She also has decreased appetite. She has used some over-the-counter Tylenol for the pain without improvement as well as throat lozenges. Denies any other aggravating or alleviating factors. No other associated symptoms.    Past Medical History  Diagnosis Date  . Crohn's disease   . Sinusitis   . Lactose intolerance    Past Surgical History  Procedure Laterality Date  . Abdominal hysterectomy    . Breast reduction surgery     No family history on file. History  Substance Use Topics  . Smoking status: Never Smoker   . Smokeless tobacco: Not on file  . Alcohol Use: No   OB History   Grav Para Term Preterm Abortions TAB SAB Ect Mult Living                 Review of Systems  Constitutional: Positive for fatigue. Negative for fever.  HENT: Positive for rhinorrhea and sore throat. Negative for congestion.   Respiratory: Negative for cough.   Gastrointestinal: Negative for nausea and vomiting.  All other systems reviewed and are negative.    Allergies  Sulfa antibiotics  Home Medications   Current Outpatient Rx  Name  Route  Sig  Dispense  Refill  . amoxicillin-clavulanate (AUGMENTIN) 875-125 MG per tablet   Oral   Take 1 tablet by mouth every 12 (twelve) hours.   20 tablet   0   . azithromycin (ZITHROMAX Z-PAK) 250 MG tablet   Oral   Take 1 tablet (250 mg total) by mouth daily.   4 tablet   0   .  ondansetron (ZOFRAN) 4 MG tablet   Oral   Take 1 tablet (4 mg total) by mouth every 6 (six) hours.   12 tablet   0   . oxyCODONE-acetaminophen (PERCOCET/ROXICET) 5-325 MG per tablet   Oral   Take 1 tablet by mouth every 4 (four) hours as needed for pain.   6 tablet   0    BP 150/98  Pulse 79  Temp(Src) 98.8 F (37.1 C) (Oral)  Resp 18  SpO2 100% Physical Exam  Nursing note and vitals reviewed. Constitutional: She is oriented to person, place, and time. She appears well-developed and well-nourished. No distress.  HENT:  Head: Normocephalic.  Right Ear: External ear normal.  Left Ear: External ear normal.  Diffuse erythema of the pharynx and tonsil area. Tonsils without significant enlargement. No significant exudate. Uvula midline. No central PTA. No lesions or petechiae within the mouth or tongue.  Eyes: Conjunctivae are normal.  Neck: Normal range of motion. Neck supple.  Cardiovascular: Normal rate and regular rhythm.   Pulmonary/Chest: Effort normal and breath sounds normal. No respiratory distress. She has no wheezes. She has no rales.  Abdominal: Soft.  Lymphadenopathy:    She has cervical adenopathy.  Neurological: She is alert and oriented to person, place, and time.  Skin: Skin is warm and dry. No  rash noted.  Psychiatric: She has a normal mood and affect. Her behavior is normal.    ED Course  Procedures   DIAGNOSTIC STUDIES: Oxygen Saturation is 100% on room air.    COORDINATION OF CARE:  Nursing notes reviewed. Vital signs reviewed. Initial pt interview and examination performed.   12:02 AM-Pt well appearing no acute distress.  Non toxic appearing.  Discussed work up plan with pt at bedside, which includes strep test. Pt agrees with plan.      MDM   1. Strep throat        Angus Seller, PA-C 10/10/13 2201

## 2013-10-11 NOTE — ED Provider Notes (Signed)
Medical screening examination/treatment/procedure(s) were performed by non-physician practitioner and as supervising physician I was immediately available for consultation/collaboration.     Julie Manly, MD 10/11/13 0159 

## 2014-01-27 ENCOUNTER — Encounter (HOSPITAL_BASED_OUTPATIENT_CLINIC_OR_DEPARTMENT_OTHER): Payer: Self-pay | Admitting: Emergency Medicine

## 2014-01-27 ENCOUNTER — Emergency Department (HOSPITAL_BASED_OUTPATIENT_CLINIC_OR_DEPARTMENT_OTHER)
Admission: EM | Admit: 2014-01-27 | Discharge: 2014-01-27 | Disposition: A | Discharge status: Home / Self Care | Payer: BC Managed Care – PPO | Attending: Emergency Medicine | Admitting: Emergency Medicine

## 2014-01-27 DIAGNOSIS — Z862 Personal history of diseases of the blood and blood-forming organs and certain disorders involving the immune mechanism: Secondary | ICD-10-CM | POA: Insufficient documentation

## 2014-01-27 DIAGNOSIS — Z792 Long term (current) use of antibiotics: Secondary | ICD-10-CM | POA: Insufficient documentation

## 2014-01-27 DIAGNOSIS — Z791 Long term (current) use of non-steroidal anti-inflammatories (NSAID): Secondary | ICD-10-CM | POA: Insufficient documentation

## 2014-01-27 DIAGNOSIS — R05 Cough: Secondary | ICD-10-CM | POA: Insufficient documentation

## 2014-01-27 DIAGNOSIS — Z8719 Personal history of other diseases of the digestive system: Secondary | ICD-10-CM | POA: Insufficient documentation

## 2014-01-27 DIAGNOSIS — Z8639 Personal history of other endocrine, nutritional and metabolic disease: Secondary | ICD-10-CM | POA: Insufficient documentation

## 2014-01-27 DIAGNOSIS — R509 Fever, unspecified: Secondary | ICD-10-CM | POA: Insufficient documentation

## 2014-01-27 DIAGNOSIS — J3489 Other specified disorders of nose and nasal sinuses: Secondary | ICD-10-CM | POA: Insufficient documentation

## 2014-01-27 DIAGNOSIS — B9789 Other viral agents as the cause of diseases classified elsewhere: Secondary | ICD-10-CM | POA: Insufficient documentation

## 2014-01-27 DIAGNOSIS — B349 Viral infection, unspecified: Secondary | ICD-10-CM

## 2014-01-27 DIAGNOSIS — R059 Cough, unspecified: Secondary | ICD-10-CM | POA: Insufficient documentation

## 2014-01-27 LAB — RAPID STREP SCREEN (MED CTR MEBANE ONLY): Streptococcus, Group A Screen (Direct): NEGATIVE

## 2014-01-27 MED ORDER — KETOROLAC TROMETHAMINE 60 MG/2ML IM SOLN
60.0000 mg | Freq: Once | INTRAMUSCULAR | Status: AC
  Administered 2014-01-27: 60 mg via INTRAMUSCULAR
  Filled 2014-01-27: qty 2

## 2014-01-27 NOTE — ED Notes (Signed)
Sore throat and fever x 5 days

## 2014-01-27 NOTE — ED Provider Notes (Signed)
CSN: [id number]     Arrival date & time 01/27/14  1711 History   First MD Initiated Contact with Patient 01/27/14 1713     Chief Complaint  Patient presents with  . Sore Throat  . Fever     (Consider location/radiation/quality/duration/timing/severity/associated sxs/prior Treatment) The history is provided by the patient.  Stacy Andrews is a 53 y.o. female hx of Crohn's disease, sinusitis here with sore throat, fever, sinus congestion. She notes some sore throat the last 5 days. Also has some runny nose and congestion as well as a nonproductive cough. She had a low-grade temperature of 100 at home. She took Tylenol with no relief. Also has myalgias. Has a history of strep throat.    Past Medical History  Diagnosis Date  . Crohn's disease   . Sinusitis   . Lactose intolerance    Past Surgical History  Procedure Laterality Date  . Abdominal hysterectomy    . Breast reduction surgery     No family history on file. History  Substance Use Topics  . Smoking status: Never Smoker   . Smokeless tobacco: Not on file  . Alcohol Use: No   OB History   Grav Para Term Preterm Abortions TAB SAB Ect Mult Living                 Review of Systems  HENT: Positive for rhinorrhea and sore throat.   All other systems reviewed and are negative.      Allergies  Sulfa antibiotics  Home Medications   Current Outpatient Rx  Name  Route  Sig  Dispense  Refill  . acetaminophen (TYLENOL) 325 MG tablet   Oral   Take 650 mg by mouth every 6 (six) hours as needed for mild pain or moderate pain.         Marland Kitchen. amoxicillin (AMOXIL) 500 MG capsule   Oral   Take 1 capsule (500 mg total) by mouth 3 (three) times daily.   30 capsule   0   . Multiple Vitamin (MULTIVITAMIN) LIQD   Oral   Take 5 mLs by mouth daily.         . naproxen (NAPROSYN) 500 MG tablet   Oral   Take 1 tablet (500 mg total) by mouth 2 (two) times daily.   30 tablet   0    BP 169/98  Pulse 63  Temp(Src) 98.3  F (36.8 C) (Oral)  Resp 18  Ht 5\' 4"  (1.626 m)  Wt 175 lb (79.379 kg)  BMI 30.02 kg/m2  SpO2 100% Physical Exam  Nursing note and vitals reviewed. Constitutional: She is oriented to person, place, and time. She appears well-developed and well-nourished.  HENT:  Head: Normocephalic.  OP minimally red, no exudates   Eyes: Conjunctivae and EOM are normal. Pupils are equal, round, and reactive to light.  Neck: Normal range of motion.  Mild cervical adenopathy   Cardiovascular: Normal rate, regular rhythm and normal heart sounds.   Pulmonary/Chest: Effort normal and breath sounds normal. No respiratory distress. She has no wheezes. She has no rales.  Abdominal: Soft. Bowel sounds are normal. She exhibits no distension. There is no tenderness. There is no rebound.  Musculoskeletal: Normal range of motion.  Neurological: She is alert and oriented to person, place, and time. No cranial nerve deficit. Coordination normal.  Skin: Skin is warm and dry.  Psychiatric: She has a normal mood and affect. Her behavior is normal. Judgment and thought content normal.  ED Course  Procedures (including critical care time) Labs Review Labs Reviewed  RAPID STREP SCREEN  CULTURE, GROUP A STREP   Imaging Review No results found.  EKG Interpretation   None       MDM   Final diagnoses:  None   Stacy Andrews is a 53 y.o. female here with sore throat, myalgias. Low centor score, will get rapid strep. Likely viral syndrome.   5:46 PM Rapid strep neg. Likely viral syndrome, d/c home.   Richardean Canal, MD 01/27/14 [phone]

## 2014-01-27 NOTE — Discharge Instructions (Signed)
Take tylenol for sore throat and myalgias.   Stay hydrated.   Rest for several days.   Follow up with your doctor.   Return to ER if you have fever > 101, severe pain, trouble swallowing.

## 2014-01-29 LAB — CULTURE, GROUP A STREP

## 2014-02-21 ENCOUNTER — Emergency Department (HOSPITAL_COMMUNITY)
Admission: EM | Admit: 2014-02-21 | Discharge: 2014-02-22 | Disposition: A | Discharge status: Home / Self Care | Payer: BC Managed Care – PPO | Attending: Emergency Medicine | Admitting: Emergency Medicine

## 2014-02-21 ENCOUNTER — Encounter (HOSPITAL_COMMUNITY): Payer: Self-pay | Admitting: Emergency Medicine

## 2014-02-21 DIAGNOSIS — R590 Localized enlarged lymph nodes: Secondary | ICD-10-CM

## 2014-02-21 DIAGNOSIS — Z862 Personal history of diseases of the blood and blood-forming organs and certain disorders involving the immune mechanism: Secondary | ICD-10-CM | POA: Insufficient documentation

## 2014-02-21 DIAGNOSIS — R1310 Dysphagia, unspecified: Secondary | ICD-10-CM

## 2014-02-21 DIAGNOSIS — R42 Dizziness and giddiness: Secondary | ICD-10-CM | POA: Insufficient documentation

## 2014-02-21 DIAGNOSIS — Z792 Long term (current) use of antibiotics: Secondary | ICD-10-CM | POA: Insufficient documentation

## 2014-02-21 DIAGNOSIS — R599 Enlarged lymph nodes, unspecified: Secondary | ICD-10-CM | POA: Insufficient documentation

## 2014-02-21 DIAGNOSIS — Z79899 Other long term (current) drug therapy: Secondary | ICD-10-CM | POA: Insufficient documentation

## 2014-02-21 DIAGNOSIS — Z8719 Personal history of other diseases of the digestive system: Secondary | ICD-10-CM | POA: Insufficient documentation

## 2014-02-21 DIAGNOSIS — Z8639 Personal history of other endocrine, nutritional and metabolic disease: Secondary | ICD-10-CM | POA: Insufficient documentation

## 2014-02-21 LAB — CBC WITH DIFFERENTIAL/PLATELET
BASOS ABS: 0 10*3/uL (ref 0.0–0.1)
Basophils Relative: 0 % (ref 0–1)
Eosinophils Absolute: 0.2 10*3/uL (ref 0.0–0.7)
Eosinophils Relative: 3 % (ref 0–5)
HCT: 35.2 % — ABNORMAL LOW (ref 36.0–46.0)
Hemoglobin: 11.6 g/dL — ABNORMAL LOW (ref 12.0–15.0)
LYMPHS PCT: 35 % (ref 12–46)
Lymphs Abs: 2.4 10*3/uL (ref 0.7–4.0)
MCH: 28.6 pg (ref 26.0–34.0)
MCHC: 33 g/dL (ref 30.0–36.0)
MCV: 86.7 fL (ref 78.0–100.0)
Monocytes Absolute: 0.7 10*3/uL (ref 0.1–1.0)
Monocytes Relative: 10 % (ref 3–12)
NEUTROS ABS: 3.5 10*3/uL (ref 1.7–7.7)
NEUTROS PCT: 51 % (ref 43–77)
PLATELETS: 283 10*3/uL (ref 150–400)
RBC: 4.06 MIL/uL (ref 3.87–5.11)
RDW: 13.6 % (ref 11.5–15.5)
WBC: 6.8 10*3/uL (ref 4.0–10.5)

## 2014-02-21 LAB — COMPREHENSIVE METABOLIC PANEL
ALBUMIN: 3.3 g/dL — AB (ref 3.5–5.2)
ALT: 8 U/L (ref 0–35)
AST: 15 U/L (ref 0–37)
Alkaline Phosphatase: 80 U/L (ref 39–117)
BILIRUBIN TOTAL: 0.2 mg/dL — AB (ref 0.3–1.2)
BUN: 6 mg/dL (ref 6–23)
CHLORIDE: 102 meq/L (ref 96–112)
CO2: 26 meq/L (ref 19–32)
Calcium: 8.9 mg/dL (ref 8.4–10.5)
Creatinine, Ser: 0.93 mg/dL (ref 0.50–1.10)
GFR calc Af Amer: 81 mL/min — ABNORMAL LOW (ref >90)
GFR, EST NON AFRICAN AMERICAN: 70 mL/min — AB (ref >90)
Glucose, Bld: 95 mg/dL (ref 70–99)
Potassium: 4 mEq/L (ref 3.7–5.3)
SODIUM: 140 meq/L (ref 137–147)
Total Protein: 7.5 g/dL (ref 6.0–8.3)

## 2014-02-21 NOTE — ED Notes (Signed)
[facility] on feb 21 with neg strep test.  PT states no improvement in the sore throat, and now she is beginning to feel dizzy when she's sitting down at night.  C/o pain with swallowing food, but not beverages.

## 2014-02-21 NOTE — ED Notes (Signed)
No complaints of dizziness presently, but patient reports a slight headache.

## 2014-02-22 ENCOUNTER — Emergency Department (HOSPITAL_COMMUNITY): Payer: BC Managed Care – PPO

## 2014-02-22 MED ORDER — NAPROXEN 500 MG PO TABS: 500.0000 mg | ORAL_TABLET | Freq: Two times a day (BID) | ORAL | Status: DC

## 2014-02-22 MED ORDER — IOHEXOL 300 MG/ML  SOLN
80.0000 mL | Freq: Once | INTRAMUSCULAR | Status: AC | PRN
  Administered 2014-02-22: 80 mL via INTRAVENOUS

## 2014-02-22 NOTE — ED Notes (Signed)
Dr. Otter at the bedside.  

## 2014-02-22 NOTE — ED Notes (Signed)
Patient transported to CT 

## 2014-02-22 NOTE — Discharge Instructions (Signed)
Your workup today has shown an enlarged lymph node on the left side.  This is most likely a reactive lymph node, due to your recent throat infection.  This will take time to resolve.  Stick to a soft diet, until your swallowing has improved.  Take Naprosyn as needed for pain.  Followup with your doctor for recheck in 3-5 days.  Drink plenty of fluids.  If your symptoms are not improving over the next 2 weeks, contact the ENT doctor listed above.   Cervical Adenitis You have a swollen lymph gland in your neck. This commonly happens with Strep and virus infections, dental problems, insect bites, and injuries about the face, scalp, or neck. The lymph glands swell as the body fights the infection or heals the injury. Swelling and firmness typically lasts for several weeks after the infection or injury is healed. Rarely lymph glands can become swollen because of cancer or TB. Antibiotics are prescribed if there is evidence of an infection. Sometimes an infected lymph gland becomes filled with pus. This condition may require opening up the abscessed gland by draining it surgically. Most of the time infected glands return to normal within two weeks. Do not poke or squeeze the swollen lymph nodes. That may keep them from shrinking back to their normal size. If the lymph gland is still swollen after 2 weeks, further medical evaluation is needed.  SEEK IMMEDIATE MEDICAL CARE IF:  You have difficulty swallowing or breathing, increased swelling, severe pain, or a high fever.  Document Released: 11/18/2005 Document Revised: 02/10/2012 Document Reviewed: 05/10/2007 [facility] Patient Information 2014 [place].   Dysphagia Swallowing problems (dysphagia) occur when solids and liquids seem to stick in your throat on the way down to your stomach, or the food takes longer to get to the stomach. Other symptoms include regurgitating food, noises coming from the throat, chest discomfort with swallowing, and a feeling  of fullness or the feeling of something being stuck in your throat when swallowing. When blockage in your throat is complete it may be associated with drooling. CAUSES  Problems with swallowing may occur because of problems with the muscles. The food cannot be propelled in the usual manner into your stomach. You may have ulcers, scar tissue, or inflammation in the tube down which food travels from your mouth to your stomach (esophagus), which blocks food from passing normally into the stomach. Causes of inflammation include:  Acid reflux from your stomach into your esophagus.  Infection.  Radiation treatment for cancer.  Medicines taken without enough fluids to wash them down into your stomach. You may have nerve problems that prevent signals from being sent to the muscles of your esophagus to contract and move your food down to your stomach. Globus pharyngeus is a relatively common problem in which there is a sense of an obstruction or difficulty in swallowing, without any physical abnormalities of the swallowing passages being found. This problem usually improves over time with reassurance and testing to rule out other causes. DIAGNOSIS Dysphagia can be diagnosed and its cause can be determined by tests in which you swallow a white substance that helps illuminate the inside of your throat (contrast medium) while X-rays are taken. Sometimes a flexible telescope that is inserted down your throat (endoscopy) to look at your esophagus and stomach is used. TREATMENT   If the dysphagia is caused by acid reflux or infection, medicines may be used.  If the dysphagia is caused by problems with your swallowing muscles, swallowing therapy  may be used to help you strengthen your swallowing muscles.  If the dysphagia is caused by a blockage or mass, procedures to remove the blockage may be done. HOME CARE INSTRUCTIONS  Try to eat soft food that is easier to swallow and check your weight on a daily basis  to be sure that it is not decreasing.  Be sure to drink liquids when sitting upright (not lying down). SEEK MEDICAL CARE IF:  You are losing weight because you are unable to swallow.  You are coughing when you drink liquids (aspiration).  You are coughing up partially digested food. SEEK IMMEDIATE MEDICAL CARE IF:  You are unable to swallow your own saliva .  You are having shortness of breath or a fever, or both.  You have a hoarse voice along with difficulty swallowing. MAKE SURE YOU:  Understand these instructions.  Will watch your condition.  Will get help right away if you are not doing well or get worse. Document Released: 11/15/2000 Document Revised: 07/21/2013 Document Reviewed: 05/07/2013 [facility] Patient Information 2014 [place], [place].   Lymphadenopathy Lymphadenopathy means "disease of the lymph glands." But the term is usually used to describe swollen or enlarged lymph glands, also called lymph nodes. These are the bean-shaped organs found in many locations including the neck, underarm, and groin. Lymph glands are part of the immune system, which fights infections in your body. Lymphadenopathy can occur in just one area of the body, such as the neck, or it can be generalized, with lymph node enlargement in several areas. The nodes found in the neck are the most common sites of lymphadenopathy. CAUSES  When your immune system responds to germs (such as viruses or bacteria ), infection-fighting cells and fluid build up. This causes the glands to grow in size. This is usually not something to worry about. Sometimes, the glands themselves can become infected and inflamed. This is called lymphadenitis. Enlarged lymph nodes can be caused by many diseases:  Bacterial disease, such as strep throat or a skin infection.  Viral disease, such as a common cold.  Other germs, such as lyme disease, tuberculosis, or sexually transmitted diseases.  Cancers, such as lymphoma  (cancer of the lymphatic system) or leukemia (cancer of the white blood cells).  Inflammatory diseases such as lupus or rheumatoid arthritis.  Reactions to medications. Many of the diseases above are rare, but important. This is why you should see your caregiver if you have lymphadenopathy. SYMPTOMS   Swollen, enlarged lumps in the neck, back of the head or other locations.  Tenderness.  Warmth or redness of the skin over the lymph nodes.  Fever. DIAGNOSIS  Enlarged lymph nodes are often near the source of infection. They can help healthcare providers diagnose your illness. For instance:   Swollen lymph nodes around the jaw might be caused by an infection in the mouth.  Enlarged glands in the neck often signal a throat infection.  Lymph nodes that are swollen in more than one area often indicate an illness caused by a virus. Your caregiver most likely will know what is causing your lymphadenopathy after listening to your history and examining you. Blood tests, x-rays or other tests may be needed. If the cause of the enlarged lymph node cannot be found, and it does not go away by itself, then a biopsy may be needed. Your caregiver will discuss this with you. TREATMENT  Treatment for your enlarged lymph nodes will depend on the cause. Many times the nodes will shrink  to normal size by themselves, with no treatment. Antibiotics or other medicines may be needed for infection. Only take over-the-counter or prescription medicines for pain, discomfort or fever as directed by your caregiver. HOME CARE INSTRUCTIONS  Swollen lymph glands usually return to normal when the underlying medical condition goes away. If they persist, contact your health-care provider. He/she might prescribe antibiotics or other treatments, depending on the diagnosis. Take any medications exactly as prescribed. Keep any follow-up appointments made to check on the condition of your enlarged nodes.  SEEK MEDICAL CARE IF:    Swelling lasts for more than two weeks.  You have symptoms such as weight loss, night sweats, fatigue or fever that does not go away.  The lymph nodes are hard, seem fixed to the skin or are growing rapidly.  Skin over the lymph nodes is red and inflamed. This could mean there is an infection. SEEK IMMEDIATE MEDICAL CARE IF:   Fluid starts leaking from the area of the enlarged lymph node.  You develop a fever of 102 F (38.9 C) or greater.  Severe pain develops (not necessarily at the site of a large lymph node).  You develop chest pain or shortness of breath.  You develop worsening abdominal pain. MAKE SURE YOU:   Understand these instructions.  Will watch your condition.  Will get help right away if you are not doing well or get worse. Document Released: 08/27/2008 Document Revised: 02/10/2012 Document Reviewed: 08/27/2008 [facility] Patient Information 2014 [place], [place].

## 2014-02-22 NOTE — ED Notes (Signed)
Patient returned from CT.  Provided socks for warmth.

## 2014-02-22 NOTE — ED Provider Notes (Signed)
CSN: [id number]     Arrival date & time 02/21/14  1801 History   First MD Initiated Contact with Patient 02/21/14 2357     Chief Complaint  Patient presents with  . Sore Throat  . Dizziness     (Consider location/radiation/quality/duration/timing/severity/associated sxs/prior Treatment) HPI 53 year old female presents to emergency department from home with complaint of one month of sore throat, difficulty swallowing solids, not liquids.  Patient reports she was seen in the emergency department at [facility] on February 21.  At that time, she was complaining of sore throat, and low-grade fevers.  Patient reports she had negative strep test, and received a shot in the hip, which she reports made her feel better for a few days.  Patient has history of Crohn's disease, seasonal allergies, sinusitis, lactose intolerance.  She is currently taking a multivitamin.  She reports she's having some rhinorrhea and postnasal drip.  She is able to drink liquids without difficulty, but reports pain and the sensation that food is catching in the back of her throat with swallowing solids.  Patient is concerned that something is wrong.  She also reports over the last 2 nights.  She has had one minute episodes of vertigo with sitting or lying.  2.  Episodes total.  No symptoms at present.  No headache, no difficulty swallowing, ambulating, weakness, numbness, or other neurologic signs. Past Medical History  Diagnosis Date  . Crohn's disease   . Sinusitis   . Lactose intolerance    Past Surgical History  Procedure Laterality Date  . Abdominal hysterectomy    . Breast reduction surgery     No family history on file. History  Substance Use Topics  . Smoking status: Never Smoker   . Smokeless tobacco: Not on file  . Alcohol Use: No   OB History   Grav Para Term Preterm Abortions TAB SAB Ect Mult Living                 Review of Systems   See History of Present Illness; otherwise all other systems are  reviewed and negative  Allergies  Sulfa antibiotics  Home Medications   Current Outpatient Rx  Name  Route  Sig  Dispense  Refill  . acetaminophen (TYLENOL) 325 MG tablet   Oral   Take 650 mg by mouth every 6 (six) hours as needed for mild pain or moderate pain.         Marland Kitchen amoxicillin (AMOXIL) 500 MG capsule   Oral   Take 1 capsule (500 mg total) by mouth 3 (three) times daily.   30 capsule   0   . Multiple Vitamin (MULTIVITAMIN) LIQD   Oral   Take 5 mLs by mouth daily.         . naproxen (NAPROSYN) 500 MG tablet   Oral   Take 1 tablet (500 mg total) by mouth 2 (two) times daily.   30 tablet   0    BP 151/93  Pulse 76  Temp(Src) 98.2 F (36.8 C) (Oral)  Resp 17  Ht 5\' 4"  (1.626 m)  Wt 192 lb (87.091 kg)  BMI 32.94 kg/m2  SpO2 100% Physical Exam  Nursing note and vitals reviewed. Constitutional: She is oriented to person, place, and time. She appears well-developed and well-nourished.  HENT:  Head: Normocephalic and atraumatic.  Right Ear: External ear normal.  Left Ear: External ear normal.  Nose: Nose normal.  Mouth/Throat: Oropharynx is clear and moist.  Eyes: Conjunctivae and EOM are  normal. Pupils are equal, round, and reactive to light.  Neck: Normal range of motion. Neck supple. No JVD present. No tracheal deviation present. No thyromegaly present.  Patient has a hoarse voice  Cardiovascular: Normal rate, regular rhythm, normal heart sounds and intact distal pulses.  Exam reveals no gallop and no friction rub.   No murmur heard. Pulmonary/Chest: Effort normal and breath sounds normal. No stridor. No respiratory distress. She has no wheezes. She has no rales. She exhibits no tenderness.  Abdominal: Soft. Bowel sounds are normal. She exhibits no distension and no mass. There is no tenderness. There is no rebound and no guarding.  Musculoskeletal: Normal range of motion. She exhibits no edema and no tenderness.  Lymphadenopathy:    She has cervical  adenopathy (patient has lymphadenopathy at left anterior cervical chain.  Area is tender.  Mass is mobile, firm, without definitive edges.  With palpation of the mass, patient reports this is the area of her pain.).  Neurological: She is alert and oriented to person, place, and time. She has normal reflexes. No cranial nerve deficit. She exhibits normal muscle tone. Coordination normal.  Skin: Skin is warm and dry. No rash noted. No erythema. No pallor.  Psychiatric: She has a normal mood and affect. Her behavior is normal. Judgment and thought content normal.    ED Course  Procedures (including critical care time) Labs Review Labs Reviewed  CBC WITH DIFFERENTIAL - Abnormal; Notable for the following:    Hemoglobin 11.6 (*)    HCT 35.2 (*)    All other components within normal limits  COMPREHENSIVE METABOLIC PANEL - Abnormal; Notable for the following:    Albumin 3.3 (*)    Total Bilirubin 0.2 (*)    GFR calc non Af Amer 70 (*)    GFR calc Af Amer 81 (*)    All other components within normal limits   Imaging Review Ct Soft Tissue Neck W Contrast  02/22/2014   CLINICAL DATA:  Severe sore throat, frequent infection. Intermittent dizziness.  EXAM: CT NECK WITH CONTRAST  TECHNIQUE: Multidetector CT imaging of the neck was performed using the standard protocol following the bolus administration of intravenous contrast.  CONTRAST:  80mL OMNIPAQUE IOHEXOL 300 MG/ML  SOLN  COMPARISON:  CT MAXILLOFACIAL W/O CM dated 08/20/2011  FINDINGS: Normal appearance of the aerodigestive tract, symmetric appearance of the palatine tonsils with preservation of the parapharyngeal fat planes. Normal appearance of the major salivary glands. Thyroid gland is unremarkable.  Similar nonspecific prominent though not pathologically enlarged lymph nodes, with no definite superimposed inflammatory changes. Normal appearance of cervical vessels. No free fluid or fluid collections within the neck.  Patient is edentulous.  Visualized paranasal sinuses and mastoid air cells are well aerated. Straightened cervical lordosis with moderate degenerative disc disease unchanged. Stable 3 mm subcutaneous nodule overlying the right parotid gland.  IMPRESSION: No CT findings of tonsillitis, peritonsillar abscess nor acute processes within the neck.  Persistent prominent though not pathologically enlarged cervical lymph nodes could be reactive.   Electronically Signed   By: Awilda Metroourtnay  Bloomer   On: 02/22/2014 01:41     EKG Interpretation None      MDM   Final diagnoses:  Lymphadenopathy of left cervical region  Dysphagia   53 year old female with sore throat for a month, lymphadenopathy.  On exam.  Suspect reactive lymph node from recent infection.  Patient does have a hoarse voice.  Other differential includes acid reflux, invasive mass.  Plan for soft tissue CT  scan of the neck.  If reactive lymph node.  Only, will have patient start on NSAIDs for pain, and followup with ENT.    Olivia Mackie, MD 02/22/14 [phone]

## 2015-12-15 ENCOUNTER — Emergency Department (HOSPITAL_COMMUNITY): Payer: BLUE CROSS/BLUE SHIELD

## 2015-12-15 ENCOUNTER — Emergency Department (HOSPITAL_COMMUNITY)
Admission: EM | Admit: 2015-12-15 | Discharge: 2015-12-15 | Disposition: A | Discharge status: Home / Self Care | Payer: BLUE CROSS/BLUE SHIELD | Attending: Emergency Medicine | Admitting: Emergency Medicine

## 2015-12-15 ENCOUNTER — Encounter (HOSPITAL_COMMUNITY): Payer: Self-pay | Admitting: Family Medicine

## 2015-12-15 DIAGNOSIS — Z79899 Other long term (current) drug therapy: Secondary | ICD-10-CM | POA: Insufficient documentation

## 2015-12-15 DIAGNOSIS — R0981 Nasal congestion: Secondary | ICD-10-CM | POA: Insufficient documentation

## 2015-12-15 DIAGNOSIS — R197 Diarrhea, unspecified: Secondary | ICD-10-CM | POA: Insufficient documentation

## 2015-12-15 DIAGNOSIS — R05 Cough: Secondary | ICD-10-CM | POA: Diagnosis not present

## 2015-12-15 DIAGNOSIS — Z791 Long term (current) use of non-steroidal anti-inflammatories (NSAID): Secondary | ICD-10-CM | POA: Diagnosis not present

## 2015-12-15 DIAGNOSIS — Z8719 Personal history of other diseases of the digestive system: Secondary | ICD-10-CM | POA: Diagnosis not present

## 2015-12-15 DIAGNOSIS — Z8709 Personal history of other diseases of the respiratory system: Secondary | ICD-10-CM | POA: Insufficient documentation

## 2015-12-15 DIAGNOSIS — R112 Nausea with vomiting, unspecified: Secondary | ICD-10-CM | POA: Diagnosis present

## 2015-12-15 DIAGNOSIS — R51 Headache: Secondary | ICD-10-CM | POA: Diagnosis not present

## 2015-12-15 DIAGNOSIS — Z8639 Personal history of other endocrine, nutritional and metabolic disease: Secondary | ICD-10-CM | POA: Diagnosis not present

## 2015-12-15 DIAGNOSIS — N390 Urinary tract infection, site not specified: Secondary | ICD-10-CM | POA: Diagnosis not present

## 2015-12-15 DIAGNOSIS — Z792 Long term (current) use of antibiotics: Secondary | ICD-10-CM | POA: Insufficient documentation

## 2015-12-15 LAB — COMPREHENSIVE METABOLIC PANEL
ALT: 12 U/L — ABNORMAL LOW (ref 14–54)
ANION GAP: 12 (ref 5–15)
AST: 21 U/L (ref 15–41)
Albumin: 3.8 g/dL (ref 3.5–5.0)
Alkaline Phosphatase: 80 U/L (ref 38–126)
BILIRUBIN TOTAL: 0.5 mg/dL (ref 0.3–1.2)
BUN: <5 mg/dL — ABNORMAL LOW (ref 6–20)
CHLORIDE: 104 mmol/L (ref 101–111)
CO2: 25 mmol/L (ref 22–32)
Calcium: 9.4 mg/dL (ref 8.9–10.3)
Creatinine, Ser: 1.04 mg/dL — ABNORMAL HIGH (ref 0.44–1.00)
GFR calc Af Amer: >60 mL/min (ref >60)
Glucose, Bld: 89 mg/dL (ref 65–99)
POTASSIUM: 3.7 mmol/L (ref 3.5–5.1)
Sodium: 141 mmol/L (ref 135–145)
TOTAL PROTEIN: 8.4 g/dL — AB (ref 6.5–8.1)

## 2015-12-15 LAB — CBC
HEMATOCRIT: 37.6 % (ref 36.0–46.0)
HEMOGLOBIN: 12.2 g/dL (ref 12.0–15.0)
MCH: 27.5 pg (ref 26.0–34.0)
MCHC: 32.4 g/dL (ref 30.0–36.0)
MCV: 84.7 fL (ref 78.0–100.0)
Platelets: 280 10*3/uL (ref 150–400)
RBC: 4.44 MIL/uL (ref 3.87–5.11)
RDW: 13.3 % (ref 11.5–15.5)
WBC: 5 10*3/uL (ref 4.0–10.5)

## 2015-12-15 LAB — URINALYSIS, ROUTINE W REFLEX MICROSCOPIC
Bilirubin Urine: NEGATIVE
Glucose, UA: NEGATIVE mg/dL
Hgb urine dipstick: NEGATIVE
KETONES UR: 15 mg/dL — AB
NITRITE: NEGATIVE
PH: 6 (ref 5.0–8.0)
Protein, ur: NEGATIVE mg/dL
SPECIFIC GRAVITY, URINE: 1.017 (ref 1.005–1.030)

## 2015-12-15 LAB — LIPASE, BLOOD: LIPASE: 28 U/L (ref 11–51)

## 2015-12-15 LAB — URINE MICROSCOPIC-ADD ON

## 2015-12-15 LAB — SEDIMENTATION RATE: SED RATE: 30 mm/h — AB (ref 0–22)

## 2015-12-15 LAB — C-REACTIVE PROTEIN: CRP: 0.6 mg/dL (ref <1.0)

## 2015-12-15 MED ORDER — SODIUM CHLORIDE 0.9 % IV BOLUS (SEPSIS)
1000.0000 mL | Freq: Once | INTRAVENOUS | Status: AC
  Administered 2015-12-15: 1000 mL via INTRAVENOUS

## 2015-12-15 MED ORDER — FENTANYL CITRATE (PF) 100 MCG/2ML IJ SOLN
50.0000 ug | Freq: Once | INTRAMUSCULAR | Status: AC
  Administered 2015-12-15: 50 ug via INTRAVENOUS
  Filled 2015-12-15: qty 2

## 2015-12-15 MED ORDER — ONDANSETRON HCL 4 MG/2ML IJ SOLN
4.0000 mg | Freq: Once | INTRAMUSCULAR | Status: AC
  Administered 2015-12-15: 4 mg via INTRAVENOUS
  Filled 2015-12-15: qty 2

## 2015-12-15 MED ORDER — CEPHALEXIN 500 MG PO CAPS: 500.0000 mg | ORAL_CAPSULE | Freq: Three times a day (TID) | ORAL | Status: AC

## 2015-12-15 MED ORDER — IOHEXOL 300 MG/ML  SOLN
100.0000 mL | Freq: Once | INTRAMUSCULAR | Status: AC | PRN
  Administered 2015-12-15: 100 mL via INTRAVENOUS

## 2015-12-15 MED ORDER — ONDANSETRON 4 MG PO TBDP: 4.0000 mg | ORAL_TABLET | Freq: Three times a day (TID) | ORAL | Status: DC | PRN

## 2015-12-15 NOTE — ED Notes (Signed)
Pt here for diarrhea and nausea x 3 weeks. sts gluten free diet is helping. Hx of chrons.

## 2015-12-15 NOTE — ED Provider Notes (Signed)
CSN: [id number]     Arrival date & time 12/15/15  1301 History   First MD Initiated Contact with Patient 12/15/15 1627     Chief Complaint  Patient presents with  . Diarrhea     (Consider location/radiation/quality/duration/timing/severity/associated sxs/prior Treatment) Patient is a 55 y.o. female presenting with diarrhea.  Diarrhea Quality:  Mucous ("sandy") Severity:  Moderate Onset quality:  Gradual Number of episodes:  5 episodes per day  Duration:  3 weeks Timing:  Constant Progression:  Worsening Relieved by:  Nothing Worsened by:  Nothing tried Ineffective treatments: gluten free diet. Associated symptoms: abdominal pain (diffuse, 3 days, cramping, worse with sitting, constant), headaches (off and on ) and vomiting (mild, started 2 days ago, small amount 1-2per day)   Associated symptoms: no chills, no recent cough and no fever   Risk factors: sick contacts   Risk factors: no recent antibiotic use, no suspicious food intake and no travel to endemic areas     Past Medical History  Diagnosis Date  . Crohn's disease (HCC)   . Sinusitis   . Lactose intolerance    Past Surgical History  Procedure Laterality Date  . Abdominal hysterectomy    . Breast reduction surgery     History reviewed. No pertinent family history. Social History  Substance Use Topics  . Smoking status: Never Smoker   . Smokeless tobacco: None  . Alcohol Use: No   OB History    No data available     Review of Systems  Constitutional: Negative for fever and chills.  HENT: Positive for congestion. Negative for sore throat.   Eyes: Negative for visual disturbance.  Respiratory: Positive for cough. Negative for shortness of breath.   Cardiovascular: Negative for chest pain.  Gastrointestinal: Positive for vomiting (mild, started 2 days ago, small amount 1-2per day), abdominal pain (diffuse, 3 days, cramping, worse with sitting, constant) and diarrhea.  Genitourinary: Negative for dysuria,  vaginal bleeding, vaginal discharge and difficulty urinating.  Musculoskeletal: Negative for back pain and neck pain.  Skin: Negative for rash.  Neurological: Positive for headaches (off and on ). Negative for syncope.      Allergies  Sulfa antibiotics  Home Medications   Prior to Admission medications   Medication Sig Start Date End Date Taking? Authorizing Provider  acetaminophen (TYLENOL) 325 MG tablet Take 650 mg by mouth every 6 (six) hours as needed for mild pain or moderate pain.   Yes Historical Provider, MD  b complex vitamins tablet Take 1 tablet by mouth daily.   Yes Historical Provider, MD  Multiple Vitamin (MULTIVITAMIN) LIQD Take 5 mLs by mouth daily.   Yes Historical Provider, MD  Multiple Vitamins-Minerals (MULTIVITAMIN WITH IRON-MINERALS) liquid Take 15 mLs by mouth daily.   Yes Historical Provider, MD  amoxicillin (AMOXIL) 500 MG capsule Take 1 capsule (500 mg total) by mouth 3 (three) times daily. 10/10/13   Ivonne Andrew, PA-C  cephALEXin (KEFLEX) 500 MG capsule Take 1 capsule (500 mg total) by mouth 3 (three) times daily. 12/15/15 12/22/15  Alvira Monday, MD  naproxen (NAPROSYN) 500 MG tablet Take 1 tablet (500 mg total) by mouth 2 (two) times daily. 02/22/14   Marisa Severin, MD  ondansetron (ZOFRAN ODT) 4 MG disintegrating tablet Take 1 tablet (4 mg total) by mouth every 8 (eight) hours as needed for nausea or vomiting. 12/15/15   Alvira Monday, MD   BP 140/87 mmHg  Pulse 65  Temp(Src) 98.6 F (37 C)  Resp 16  SpO2 100% Physical  Exam  Constitutional: She is oriented to person, place, and time. She appears well-developed and well-nourished. No distress.  HENT:  Head: Normocephalic and atraumatic.  Eyes: Conjunctivae and EOM are normal.  Neck: Normal range of motion.  Cardiovascular: Normal rate, regular rhythm, normal heart sounds and intact distal pulses.  Exam reveals no gallop and no friction rub.   No murmur heard. Pulmonary/Chest: Effort normal and breath  sounds normal. No respiratory distress. She has no wheezes. She has no rales.  Abdominal: Soft. She exhibits no distension. There is tenderness in the suprapubic area. There is tenderness at McBurney's point. There is no guarding, no CVA tenderness and negative Murphy's sign.  Musculoskeletal: She exhibits no edema or tenderness.  Neurological: She is alert and oriented to person, place, and time.  Skin: Skin is warm and dry. No rash noted. She is not diaphoretic. No erythema.  Nursing note and vitals reviewed.   ED Course  Procedures (including critical care time) Labs Review Labs Reviewed  COMPREHENSIVE METABOLIC PANEL - Abnormal; Notable for the following:    BUN <5 (*)    Creatinine, Ser 1.04 (*)    Total Protein 8.4 (*)    ALT 12 (*)    All other components within normal limits  URINALYSIS, ROUTINE W REFLEX MICROSCOPIC (NOT AT [facility]) - Abnormal; Notable for the following:    Color, Urine AMBER (*)    APPearance CLOUDY (*)    Ketones, ur 15 (*)    Leukocytes, UA MODERATE (*)    All other components within normal limits  URINE MICROSCOPIC-ADD ON - Abnormal; Notable for the following:    Squamous Epithelial / LPF 0-5 (*)    Bacteria, UA MANY (*)    All other components within normal limits  SEDIMENTATION RATE - Abnormal; Notable for the following:    Sed Rate 30 (*)    All other components within normal limits  LIPASE, BLOOD  CBC  C-REACTIVE PROTEIN    Imaging Review Ct Abdomen Pelvis W Contrast  12/15/2015  CLINICAL DATA:  54 [name] C/O abdominal pain ongoing for about a couple of weeks with N/V. HX of Crohns disease LEFT acute CT ABDOMEN AND PELVIS WITH CONTRAST TECHNIQUE: Multidetector CT imaging of the abdomen and pelvis was performed using the standard protocol following bolus administration of intravenous contrast. CONTRAST:  OMNIPAQUE IOHEXOL 300 MG/ML  SOLN COMPARISON:  None. FINDINGS: Lower chest: Lung bases are clear. Hepatobiliary: The most superior portion of the  liver is not imaged. Visualized liver appears normal without evidence of dilatation or enhancing lesion. Normal gallbladder. Normal common bile duct. Pancreas: Pancreas is normal. No ductal dilatation. No pancreatic inflammation. Spleen: Normal spleen Adrenals/urinary tract: Adrenal glands and kidneys are normal. The ureters and bladder normal. Stomach/Bowel: Stomach, duodenum small bowel normal. Appendix is normal. The normal terminal ileum is normal without evidence of inflammation. No stricture of the small bowel identified. The colon and rectosigmoid colon are normal. Vascular/Lymphatic: Abdominal aorta is normal caliber. No retroperitoneal or periportal lymphadenopathy. No pelvic lymphadenopathy. Reproductive: Post hysterectomy anatomy. 3.8 cm cyst of the RIGHT ovary has simple fluid attenuation and benign CT imaging characteristics. LEFT ovary not identified. Other: No free fluid. The small umbilical hernia contains a short segment of small bowel. Musculoskeletal: No aggressive osseous lesion. IMPRESSION: 1. No abnormality of the GI tract. Normal terminal ileum. Normal appendix. 2. Benign-appearing cystic lesion of the RIGHT ovary. No follow-up recommended if patient is premenopausal. If postmenopausal, recommend pelvic ultrasound in 6 to 12  weeks. This recommendation follows ACR consensus guidelines: White Paper of the ACR Incidental Findings Committee II on Adnexal Findings. J Am Coll Radiol 2013:10:675-681. 3. Small umbilical hernia contains a short segment of small bowel. Electronically Signed   By: Genevive BiStewart  Edmunds M.D.   On: 12/15/2015 18:26   I have personally reviewed and evaluated these images and lab results as part of my medical decision-making.   EKG Interpretation None      MDM   Final diagnoses:  UTI (lower urinary tract infection)  Nausea vomiting and diarrhea   55 year old female with a history of Crohn's disease presents with concern ofausea, vomiting and diarrhea.  CT abdomen  done given history of Crohn's to evaluate for signs of flare, fistula or abscess and showed no acute intra-abdominal abnormalities. Discussed recommendation for follow-up ultrasound of ovarian cyst in 6-12 weeks if patient is postmenopausal. (hx of hysterectomy, no hot flashes)  Given IV fluids and nausea medicines with improvement in symptoms. No recent antibiotic use and doubt C. Difficile.  Patient most likely with a viral syndrome causing nausea vomiting and diarrhea. Urinalysis is concerning for possible UTI, and patient was given one week of Keflex and do not feel clinical hx is consistent with pyelonephritis at this time.  Discussed that if patient begins to have worsening symptoms including flank pain, intractable nausea vomiting, fevers that she should return for evaluation. Patient discharged in stable condition with understanding of reasons to return.    Alvira MondayErin Cherrill Scrima, MD 12/16/15 [phone]

## 2016-03-11 ENCOUNTER — Emergency Department (HOSPITAL_COMMUNITY)
Admission: EM | Admit: 2016-03-11 | Discharge: 2016-03-11 | Disposition: A | Discharge status: Home / Self Care | Payer: BLUE CROSS/BLUE SHIELD | Attending: Emergency Medicine | Admitting: Emergency Medicine

## 2016-03-11 ENCOUNTER — Emergency Department (HOSPITAL_COMMUNITY): Payer: BLUE CROSS/BLUE SHIELD

## 2016-03-11 ENCOUNTER — Encounter (HOSPITAL_COMMUNITY): Payer: Self-pay | Admitting: Emergency Medicine

## 2016-03-11 DIAGNOSIS — Z791 Long term (current) use of non-steroidal anti-inflammatories (NSAID): Secondary | ICD-10-CM | POA: Diagnosis not present

## 2016-03-11 DIAGNOSIS — Z79899 Other long term (current) drug therapy: Secondary | ICD-10-CM | POA: Insufficient documentation

## 2016-03-11 DIAGNOSIS — J020 Streptococcal pharyngitis: Secondary | ICD-10-CM | POA: Diagnosis not present

## 2016-03-11 DIAGNOSIS — R630 Anorexia: Secondary | ICD-10-CM | POA: Diagnosis not present

## 2016-03-11 DIAGNOSIS — Z8719 Personal history of other diseases of the digestive system: Secondary | ICD-10-CM | POA: Insufficient documentation

## 2016-03-11 DIAGNOSIS — Z792 Long term (current) use of antibiotics: Secondary | ICD-10-CM | POA: Diagnosis not present

## 2016-03-11 DIAGNOSIS — Z8639 Personal history of other endocrine, nutritional and metabolic disease: Secondary | ICD-10-CM | POA: Insufficient documentation

## 2016-03-11 DIAGNOSIS — N390 Urinary tract infection, site not specified: Secondary | ICD-10-CM | POA: Diagnosis not present

## 2016-03-11 DIAGNOSIS — R197 Diarrhea, unspecified: Secondary | ICD-10-CM | POA: Insufficient documentation

## 2016-03-11 DIAGNOSIS — R05 Cough: Secondary | ICD-10-CM | POA: Diagnosis present

## 2016-03-11 LAB — URINALYSIS, ROUTINE W REFLEX MICROSCOPIC
Bilirubin Urine: NEGATIVE
Glucose, UA: NEGATIVE mg/dL
KETONES UR: NEGATIVE mg/dL
NITRITE: NEGATIVE
PROTEIN: NEGATIVE mg/dL
Specific Gravity, Urine: 1.011 (ref 1.005–1.030)
pH: 6.5 (ref 5.0–8.0)

## 2016-03-11 LAB — CBC
HCT: 38.1 % (ref 36.0–46.0)
Hemoglobin: 11.8 g/dL — ABNORMAL LOW (ref 12.0–15.0)
MCH: 26.7 pg (ref 26.0–34.0)
MCHC: 31 g/dL (ref 30.0–36.0)
MCV: 86.2 fL (ref 78.0–100.0)
PLATELETS: 289 10*3/uL (ref 150–400)
RBC: 4.42 MIL/uL (ref 3.87–5.11)
RDW: 13.9 % (ref 11.5–15.5)
WBC: 5.3 10*3/uL (ref 4.0–10.5)

## 2016-03-11 LAB — COMPREHENSIVE METABOLIC PANEL
ALK PHOS: 70 U/L (ref 38–126)
ALT: 11 U/L — AB (ref 14–54)
AST: 20 U/L (ref 15–41)
Albumin: 3.5 g/dL (ref 3.5–5.0)
Anion gap: 11 (ref 5–15)
BUN: <5 mg/dL — ABNORMAL LOW (ref 6–20)
CALCIUM: 9.2 mg/dL (ref 8.9–10.3)
CO2: 26 mmol/L (ref 22–32)
CREATININE: 1.1 mg/dL — AB (ref 0.44–1.00)
Chloride: 104 mmol/L (ref 101–111)
GFR, EST NON AFRICAN AMERICAN: 56 mL/min — AB (ref >60)
Glucose, Bld: 74 mg/dL (ref 65–99)
Potassium: 3.8 mmol/L (ref 3.5–5.1)
Sodium: 141 mmol/L (ref 135–145)
Total Bilirubin: 0.5 mg/dL (ref 0.3–1.2)
Total Protein: 8.2 g/dL — ABNORMAL HIGH (ref 6.5–8.1)

## 2016-03-11 LAB — URINE MICROSCOPIC-ADD ON

## 2016-03-11 LAB — RAPID STREP SCREEN (MED CTR MEBANE ONLY): STREPTOCOCCUS, GROUP A SCREEN (DIRECT): POSITIVE — AB

## 2016-03-11 LAB — LIPASE, BLOOD: Lipase: 27 U/L (ref 11–51)

## 2016-03-11 MED ORDER — CEPHALEXIN 500 MG PO CAPS: 500.0000 mg | ORAL_CAPSULE | Freq: Two times a day (BID) | ORAL | Status: DC

## 2016-03-11 MED ORDER — ONDANSETRON HCL 4 MG/2ML IJ SOLN
4.0000 mg | Freq: Once | INTRAMUSCULAR | Status: AC
  Administered 2016-03-11: 4 mg via INTRAVENOUS
  Filled 2016-03-11: qty 2

## 2016-03-11 MED ORDER — SODIUM CHLORIDE 0.9 % IV BOLUS (SEPSIS)
1000.0000 mL | Freq: Once | INTRAVENOUS | Status: AC
  Administered 2016-03-11: 1000 mL via INTRAVENOUS

## 2016-03-11 MED ORDER — ONDANSETRON HCL 4 MG PO TABS: 4.0000 mg | ORAL_TABLET | Freq: Four times a day (QID) | ORAL | Status: DC

## 2016-03-11 NOTE — ED Notes (Signed)
Patient placed in a gown, socks provided.  Patient asked to provide a urine sample.

## 2016-03-11 NOTE — ED Notes (Signed)
Patient given a Ginger Ale, patient tolerating fluids.

## 2016-03-11 NOTE — ED Notes (Signed)
Electronic signature pad not working but patient acknowledged discharge instructions and verbalized understanding of prescriptions and the need for follow up.  Patient alert and oriented at discharge.  Patient ambulatory to the waiting room with stable and steady gait.

## 2016-03-11 NOTE — ED Provider Notes (Signed)
CSN: [id number]     Arrival date & time 03/11/16  0901 History   First MD Initiated Contact with Patient 03/11/16 1000     Chief Complaint  Patient presents with  . Nausea  . Diarrhea  . Cough   HPI Comments: 55 year old female presents with fever and chills with cough, nausea, vomiting, diarrhea for the past 3 days. She states started with cough and sore throat. Her cough has become more productive. She denies having a flu shot this year stating every time she has a flu shot she gets the flu. Past medical history significant for Crohn's disease however she says her diarrhea is nonbloody and not painful. She is reported decreased urine. She has not tried any over-the-counter medicines at home. Reports sick contacts at work. Denies HA, ear pain, congestion, rhinorrhea, chest pain, SOB, dysuria, vaginal discharge.  Patient is a 55 y.o. female presenting with diarrhea and cough.  Diarrhea Associated symptoms: abdominal pain, chills, fever and vomiting   Associated symptoms: no myalgias   Cough Associated symptoms: chills, fever and sore throat   Associated symptoms: no chest pain, no myalgias, no rhinorrhea, no shortness of breath and no wheezing     Past Medical History  Diagnosis Date  . Crohn's disease (HCC)   . Sinusitis   . Lactose intolerance    Past Surgical History  Procedure Laterality Date  . Abdominal hysterectomy    . Breast reduction surgery     History reviewed. No pertinent family history. Social History  Substance Use Topics  . Smoking status: Never Smoker   . Smokeless tobacco: None  . Alcohol Use: No   OB History    No data available     Review of Systems  Constitutional: Positive for fever, chills and appetite change.  HENT: Positive for sore throat. Negative for congestion, rhinorrhea and sinus pressure.   Respiratory: Positive for cough. Negative for shortness of breath and wheezing.   Cardiovascular: Negative for chest pain.  Gastrointestinal: Positive  for nausea, vomiting, abdominal pain and diarrhea. Negative for constipation.  Genitourinary: Positive for decreased urine volume. Negative for dysuria and hematuria.  Musculoskeletal: Negative for myalgias.  Neurological: Negative for light-headedness.  All other systems reviewed and are negative.     Allergies  Sulfa antibiotics  Home Medications   Prior to Admission medications   Medication Sig Start Date End Date Taking? Authorizing Provider  acetaminophen (TYLENOL) 325 MG tablet Take 650 mg by mouth every 6 (six) hours as needed for mild pain or moderate pain.    Historical Provider, MD  amoxicillin (AMOXIL) 500 MG capsule Take 1 capsule (500 mg total) by mouth 3 (three) times daily. 10/10/13   Ivonne Andrew, PA-C  b complex vitamins tablet Take 1 tablet by mouth daily.    Historical Provider, MD  Multiple Vitamin (MULTIVITAMIN) LIQD Take 5 mLs by mouth daily.    Historical Provider, MD  Multiple Vitamins-Minerals (MULTIVITAMIN WITH IRON-MINERALS) liquid Take 15 mLs by mouth daily.    Historical Provider, MD  naproxen (NAPROSYN) 500 MG tablet Take 1 tablet (500 mg total) by mouth 2 (two) times daily. 02/22/14   Marisa Severin, MD  ondansetron (ZOFRAN ODT) 4 MG disintegrating tablet Take 1 tablet (4 mg total) by mouth every 8 (eight) hours as needed for nausea or vomiting. 12/15/15   Alvira Monday, MD   BP 143/101 mmHg  Pulse 70  Temp(Src) 98.5 F (36.9 C) (Oral)  Resp 16  SpO2 100%   Physical Exam  Constitutional:  She is oriented to person, place, and time. She appears well-developed and well-nourished. No distress.  HENT:  Head: Normocephalic and atraumatic.  Right Ear: Hearing, tympanic membrane, external ear and ear canal normal.  Left Ear: Hearing, tympanic membrane, external ear and ear canal normal.  Nose: No mucosal edema or rhinorrhea.  Mouth/Throat: Uvula is midline, oropharynx is clear and moist and mucous membranes are normal.  Eyes: Conjunctivae are normal. Pupils  are equal, round, and reactive to light. Right eye exhibits no discharge. Left eye exhibits no discharge. No scleral icterus.  Neck: Normal range of motion.  Cardiovascular: Normal rate and regular rhythm.  Exam reveals no gallop and no friction rub.   No murmur heard. Pulmonary/Chest: Effort normal and breath sounds normal. No respiratory distress. She has no wheezes. She has no rales. She exhibits no tenderness.  Abdominal: Soft. Bowel sounds are normal. She exhibits no distension and no mass. There is no hepatosplenomegaly. There is tenderness. There is no rebound, no guarding and no CVA tenderness.  Diffuse tenderness throughout all quadrants. Well healed midline scar from colectomy  Lymphadenopathy:    She has cervical adenopathy.  Neurological: She is alert and oriented to person, place, and time.  Skin: Skin is warm and dry.  Psychiatric: She has a normal mood and affect.    ED Course  Procedures (including critical care time) Labs Review Labs Reviewed  CBC - Abnormal; Notable for the following:    Hemoglobin 11.8 (*)    All other components within normal limits  URINALYSIS, ROUTINE W REFLEX MICROSCOPIC (NOT AT [facility]) - Abnormal; Notable for the following:    APPearance CLOUDY (*)    Hgb urine dipstick TRACE (*)    Leukocytes, UA LARGE (*)    All other components within normal limits  URINE MICROSCOPIC-ADD ON - Abnormal; Notable for the following:    Squamous Epithelial / LPF 6-30 (*)    Bacteria, UA FEW (*)    All other components within normal limits  RAPID STREP SCREEN (NOT AT [facility])  LIPASE, BLOOD  COMPREHENSIVE METABOLIC PANEL    Imaging Review Dg Chest 2 View  03/11/2016  CLINICAL DATA:  Cough.  Fever EXAM: CHEST  2 VIEW COMPARISON:  08/20/2011 FINDINGS: The heart size and mediastinal contours are within normal limits. Both lungs are clear. The visualized skeletal structures are unremarkable. IMPRESSION: No active cardiopulmonary disease. Electronically Signed   By:  Marlan Palauharles  Clark M.D.   On: 03/11/2016 09:30   I have personally reviewed and evaluated these images and lab results as part of my medical decision-making.   EKG Interpretation None      MDM   Final diagnoses:  Strep pharyngitis  UTI (lower urinary tract infection)   55 year old female presents with fever, cough, sore throat, N/V/D for 3 days. Her labs are unremarkable. CXR is negative. Rapid strep was positive and UA had +WBC, RBC, bacteria, and leukocytes. IVF and Zofran given.  Patient is non-toxic, NAD, afebrile with stable VS. Patient notified of results. Will treat with Keflex 500mg  BID for 10 days. Rx for Zofran prn. Recommended NSAIDs, salt water gargles, fluids, and hand washing. Return precautions given.    Bethel BornKelly Marie Gekas, PA-C 03/11/16 1219  Loren Raceravid Yelverton, MD 03/12/16 1431

## 2016-03-11 NOTE — ED Notes (Signed)
Pt sts N/V/D x 3 days with cold chills and fever; pt sts cough and sore throat

## 2016-03-13 LAB — URINE CULTURE

## 2016-03-14 ENCOUNTER — Telehealth (HOSPITAL_BASED_OUTPATIENT_CLINIC_OR_DEPARTMENT_OTHER): Payer: Self-pay | Admitting: Emergency Medicine

## 2016-03-14 NOTE — Telephone Encounter (Signed)
Post ED Visit - Positive Culture Follow-up  Culture report reviewed by antimicrobial stewardship pharmacist:  []  Enzo BiNathan Batchelder, Pharm.D. []  Celedonio MiyamotoJeremy Frens, Pharm.D., BCPS []  Garvin FilaMike Maccia, Pharm.D. []  Georgina PillionElizabeth Martin, Pharm.D., BCPS []  BeloitMinh Pham, [address]Ph[address]D., BCPS, AAHIVP []  Estella HuskMichelle Turner, Pharm.D., BCPS, AAHIVP []  Tennis Mustassie Stewart, Pharm.D. []  Sherle Poeob Vincent, [address]Ph[address]Lona Kettle. Meredith TIlley PharmD  Positive urine culture Treated with cephalexin, organism sensitive to the same and no further patient follow-up is required at this time.  Berle MullMiller, Collyns Mcquigg 03/14/2016, 9:42 AM

## 2016-07-18 ENCOUNTER — Encounter (HOSPITAL_BASED_OUTPATIENT_CLINIC_OR_DEPARTMENT_OTHER): Payer: Self-pay

## 2016-07-18 ENCOUNTER — Emergency Department (HOSPITAL_BASED_OUTPATIENT_CLINIC_OR_DEPARTMENT_OTHER): Payer: BLUE CROSS/BLUE SHIELD

## 2016-07-18 ENCOUNTER — Emergency Department (HOSPITAL_BASED_OUTPATIENT_CLINIC_OR_DEPARTMENT_OTHER)
Admission: EM | Admit: 2016-07-18 | Discharge: 2016-07-18 | Disposition: A | Discharge status: Home / Self Care | Payer: BLUE CROSS/BLUE SHIELD | Attending: Emergency Medicine | Admitting: Emergency Medicine

## 2016-07-18 DIAGNOSIS — R51 Headache: Secondary | ICD-10-CM | POA: Diagnosis present

## 2016-07-18 DIAGNOSIS — R300 Dysuria: Secondary | ICD-10-CM | POA: Diagnosis not present

## 2016-07-18 DIAGNOSIS — R519 Headache, unspecified: Secondary | ICD-10-CM

## 2016-07-18 DIAGNOSIS — R21 Rash and other nonspecific skin eruption: Secondary | ICD-10-CM | POA: Diagnosis not present

## 2016-07-18 LAB — CBC WITH DIFFERENTIAL/PLATELET
BASOS PCT: 0 %
Basophils Absolute: 0 10*3/uL (ref 0.0–0.1)
EOS ABS: 0.2 10*3/uL (ref 0.0–0.7)
EOS PCT: 2 %
HCT: 36.6 % (ref 36.0–46.0)
HEMOGLOBIN: 11.7 g/dL — AB (ref 12.0–15.0)
LYMPHS ABS: 1.5 10*3/uL (ref 0.7–4.0)
Lymphocytes Relative: 23 %
MCH: 27.9 pg (ref 26.0–34.0)
MCHC: 32 g/dL (ref 30.0–36.0)
MCV: 87.4 fL (ref 78.0–100.0)
MONOS PCT: 10 %
Monocytes Absolute: 0.7 10*3/uL (ref 0.1–1.0)
Neutro Abs: 4.3 10*3/uL (ref 1.7–7.7)
Neutrophils Relative %: 65 %
PLATELETS: 281 10*3/uL (ref 150–400)
RBC: 4.19 MIL/uL (ref 3.87–5.11)
RDW: 13.7 % (ref 11.5–15.5)
WBC: 6.6 10*3/uL (ref 4.0–10.5)

## 2016-07-18 LAB — COMPREHENSIVE METABOLIC PANEL
ALT: 10 U/L — AB (ref 14–54)
AST: 20 U/L (ref 15–41)
Albumin: 3.9 g/dL (ref 3.5–5.0)
Alkaline Phosphatase: 73 U/L (ref 38–126)
Anion gap: 7 (ref 5–15)
BUN: 7 mg/dL (ref 6–20)
CHLORIDE: 105 mmol/L (ref 101–111)
CO2: 27 mmol/L (ref 22–32)
CREATININE: 0.97 mg/dL (ref 0.44–1.00)
Calcium: 9 mg/dL (ref 8.9–10.3)
GFR calc Af Amer: >60 mL/min (ref >60)
GFR calc non Af Amer: >60 mL/min (ref >60)
GLUCOSE: 84 mg/dL (ref 65–99)
Potassium: 3.9 mmol/L (ref 3.5–5.1)
SODIUM: 139 mmol/L (ref 135–145)
Total Bilirubin: 0.4 mg/dL (ref 0.3–1.2)
Total Protein: 7.8 g/dL (ref 6.5–8.1)

## 2016-07-18 LAB — LIPASE, BLOOD: LIPASE: 30 U/L (ref 11–51)

## 2016-07-18 MED ORDER — HYDROCODONE-ACETAMINOPHEN 5-325 MG PO TABS: 1.0000 | ORAL_TABLET | Freq: Four times a day (QID) | ORAL | 0 refills | Status: DC | PRN

## 2016-07-18 MED ORDER — PREDNISONE 10 MG PO TABS
40.0000 mg | ORAL_TABLET | Freq: Every day | ORAL | 0 refills | Status: DC
Start: 2016-07-18 — End: 2019-11-18

## 2016-07-18 MED ORDER — PREDNISONE 50 MG PO TABS
60.0000 mg | ORAL_TABLET | Freq: Once | ORAL | Status: AC
  Administered 2016-07-18: 60 mg via ORAL
  Filled 2016-07-18: qty 1

## 2016-07-18 MED ORDER — FAMOTIDINE 20 MG PO TABS
20.0000 mg | ORAL_TABLET | Freq: Two times a day (BID) | ORAL | 0 refills | Status: DC
Start: 2016-07-18 — End: 2019-11-18

## 2016-07-18 NOTE — ED Triage Notes (Signed)
C/o HA , nausea, facial swelling x today-NAD-steady gait

## 2016-07-18 NOTE — Discharge Instructions (Signed)
For the rash and facial swelling. Continue the Benadryl at least for the next 24 hours. Take the Pepcid for 7 days. Take prednisone as directed for the next 5 days. Take the hydrocodone as needed for the headache. Follow-up with your doctor if not improving. Return here for any new or worse symptoms.

## 2016-07-18 NOTE — ED Notes (Signed)
Patient transported to CT 

## 2016-07-18 NOTE — ED Provider Notes (Signed)
[facility] Provider Note   CSN: [id number] Arrival date & time: 07/18/16  1439     History   Chief Complaint Chief Complaint  Patient presents with  . Headache    HPI Stacy Andrews is a 55 y.o. female.  Patient with a complaint of a perioral rash and facial swelling patient feels of tongue is swollen started today. Patient associated with posterior headache. Patient denies any new creams or anything that she can think that would've caused an allergic reaction. Headache is posterior. 8 out of 10 no nausea no vomiting no visual changes no numbness no weakness. No neck stiffness.      Past Medical History:  Diagnosis Date  . Crohn's disease (HCC)   . Lactose intolerance   . Sinusitis     Patient Active Problem List   Diagnosis Date Noted  . ALLERGY UNSPECIFIED NOT ELSEWHERE CLASSIFIED 07/26/2010  . 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 CROHN'S DISEASE 09/21/2009  . BREAST PAIN, BILATERAL 09/21/2009    Past Surgical History:  Procedure Laterality Date  . ABDOMINAL HYSTERECTOMY    . BREAST REDUCTION SURGERY      OB History    No data available       Home Medications    Prior to Admission medications   Medication Sig Start Date End Date Taking? Authorizing Provider  b complex vitamins tablet Take 1 tablet by mouth daily as needed (for supplementation).     Historical Provider, MD  famotidine (PEPCID) 20 MG tablet Take 1 tablet (20 mg total) by mouth 2 (two) times daily. 07/18/16   Vanetta MuldersScott Nelwyn Hebdon, MD  HYDROcodone-acetaminophen (NORCO/VICODIN) 5-325 MG tablet Take 1-2 tablets by mouth every 6 (six) hours as needed for moderate pain. 07/18/16   Vanetta MuldersScott Loni Delbridge, MD  Multiple Vitamins-Minerals (MULTIVITAMIN WITH IRON-MINERALS) liquid Take 15 mLs by mouth daily.    Historical Provider, MD  predniSONE (DELTASONE) 10 MG tablet Take 4 tablets (40 mg total) by mouth daily. 07/18/16   Vanetta MuldersScott Anselma Herbel, MD    Family History No family history on file.  Social  History Social History  Substance Use Topics  . Smoking status: Never Smoker  . Smokeless tobacco: Never Used  . Alcohol use No     Allergies   Gluten meal; Wheat bran; and Sulfa antibiotics   Review of Systems Review of Systems  Constitutional: Negative for fever.  HENT: Negative for congestion and sinus pressure.   Eyes: Negative for redness and visual disturbance.  Cardiovascular: Negative for chest pain.  Gastrointestinal: Negative for abdominal pain, nausea and vomiting.  Genitourinary: Positive for dysuria.  Musculoskeletal: Negative for back pain and neck pain.  Skin: Positive for rash.  Neurological: Positive for headaches.  Hematological: Does not bruise/bleed easily.  Psychiatric/Behavioral: Negative for confusion.     Physical Exam Updated Vital Signs BP 128/76 (BP Location: Right Arm)   Pulse 60   Temp 98.7 F (37.1 C) (Oral)   Resp 18   SpO2 100%   Physical Exam  Constitutional: She is oriented to person, place, and time. She appears well-developed and well-nourished. No distress.  HENT:  Head: Normocephalic.  Mouth/Throat: Oropharynx is clear and moist. No oropharyngeal exudate.  Papular rash perioral with some swelling no lip swelling no significant tongue swelling.  Eyes: Conjunctivae and EOM are normal. Pupils are equal, round, and reactive to light.  Neck: Normal range of motion. Neck supple.  Cardiovascular: Normal rate, regular rhythm and normal heart sounds.   Pulmonary/Chest: Effort normal and breath  sounds normal.  Abdominal: Soft. Bowel sounds are normal. There is no tenderness.  Neurological: She is alert and oriented to person, place, and time. No cranial nerve deficit. She exhibits normal muscle tone. Coordination normal.  Skin: Skin is warm. No erythema.  Nursing note and vitals reviewed.    ED Treatments / Results  Labs (all labs ordered are listed, but only abnormal results are displayed) Labs Reviewed  COMPREHENSIVE METABOLIC  PANEL - Abnormal; Notable for the following:       Result Value   ALT 10 (*)    All other components within normal limits  CBC WITH DIFFERENTIAL/PLATELET - Abnormal; Notable for the following:    Hemoglobin 11.7 (*)    All other components within normal limits  LIPASE, BLOOD    EKG  EKG Interpretation None       Radiology Ct Head Wo Contrast  Result Date: 07/18/2016 CLINICAL DATA:  Headache EXAM: CT HEAD WITHOUT CONTRAST TECHNIQUE: Contiguous axial images were obtained from the base of the skull through the vertex without intravenous contrast. COMPARISON:  None FINDINGS: Brain: No intracranial hemorrhage, mass effect or midline shift. No acute cortical infarction. No mass lesion is noted on this unenhanced scan. Ventricular size is stable from prior exam. There is cavum septum pellucidum Vascular: No hyperdense vessel or unexpected calcification. Skull: No skull fracture Sinuses/Orbits: Paranasal sinuses and mastoid air cells are unremarkable Other: None IMPRESSION: No acute intracranial abnormality. Electronically Signed   By: Natasha MeadLiviu  Pop M.D.   On: 07/18/2016 17:16    Procedures Procedures (including critical care time)  Medications Ordered in ED Medications  predniSONE (DELTASONE) tablet 60 mg (60 mg Oral Given 07/18/16 1845)     Initial Impression / Assessment and Plan / ED Course  I have reviewed the triage vital signs and the nursing notes.  Pertinent labs & imaging results that were available during my care of the patient were reviewed by me and considered in my medical decision making (see chart for details).  Clinical Course    Patient with a peri oral dermatitis. With some swelling. No significant tongue or lip swelling. The rash started today.Marland Kitchen. Headache is new on the back of the head area. Head CT negative. No nausea no vomiting no visual changes. No history of similar headaches. Patient's had trouble with sinusitis in the past but this is different. Workup without  any acute findings. Will treat symptomatically for the rash with prednisone will go ahead and have her continue Benadryl and throwing Pepcid as well. She has primary care doctor to follow-up with. Patient has a history of Crohn's disease was having no GI symptoms. No concerns for meningitis. No fever. Labs without significant abnormalities. Will treat symptomatically.  Final Clinical Impressions(s) / ED Diagnoses   Final diagnoses:  Acute intractable headache, unspecified headache type  Facial rash    New Prescriptions New Prescriptions   FAMOTIDINE (PEPCID) 20 MG TABLET    Take 1 tablet (20 mg total) by mouth 2 (two) times daily.   HYDROCODONE-ACETAMINOPHEN (NORCO/VICODIN) 5-325 MG TABLET    Take 1-2 tablets by mouth every 6 (six) hours as needed for moderate pain.   PREDNISONE (DELTASONE) 10 MG TABLET    Take 4 tablets (40 mg total) by mouth daily.     Vanetta MuldersScott Dewaine Morocho, MD 07/18/16 [phone]

## 2017-09-19 ENCOUNTER — Emergency Department (HOSPITAL_COMMUNITY): Payer: BLUE CROSS/BLUE SHIELD

## 2017-09-19 ENCOUNTER — Encounter (HOSPITAL_COMMUNITY): Payer: Self-pay | Admitting: Emergency Medicine

## 2017-09-19 ENCOUNTER — Emergency Department (HOSPITAL_COMMUNITY)
Admission: EM | Admit: 2017-09-19 | Discharge: 2017-09-20 | Disposition: A | Discharge status: Home / Self Care | Payer: BLUE CROSS/BLUE SHIELD | Attending: Emergency Medicine | Admitting: Emergency Medicine

## 2017-09-19 DIAGNOSIS — R112 Nausea with vomiting, unspecified: Secondary | ICD-10-CM | POA: Insufficient documentation

## 2017-09-19 DIAGNOSIS — R1084 Generalized abdominal pain: Secondary | ICD-10-CM

## 2017-09-19 DIAGNOSIS — R109 Unspecified abdominal pain: Secondary | ICD-10-CM | POA: Diagnosis not present

## 2017-09-19 DIAGNOSIS — R197 Diarrhea, unspecified: Secondary | ICD-10-CM | POA: Insufficient documentation

## 2017-09-19 DIAGNOSIS — Z79899 Other long term (current) drug therapy: Secondary | ICD-10-CM | POA: Insufficient documentation

## 2017-09-19 LAB — COMPREHENSIVE METABOLIC PANEL
ALK PHOS: 92 U/L (ref 38–126)
ALT: 11 U/L — AB (ref 14–54)
ANION GAP: 9 (ref 5–15)
AST: 22 U/L (ref 15–41)
Albumin: 3.8 g/dL (ref 3.5–5.0)
BILIRUBIN TOTAL: 0.5 mg/dL (ref 0.3–1.2)
BUN: <5 mg/dL — ABNORMAL LOW (ref 6–20)
CALCIUM: 9 mg/dL (ref 8.9–10.3)
CO2: 24 mmol/L (ref 22–32)
CREATININE: 1.05 mg/dL — AB (ref 0.44–1.00)
Chloride: 104 mmol/L (ref 101–111)
GFR, EST NON AFRICAN AMERICAN: 59 mL/min — AB (ref >60)
Glucose, Bld: 84 mg/dL (ref 65–99)
Potassium: 4.2 mmol/L (ref 3.5–5.1)
SODIUM: 137 mmol/L (ref 135–145)
TOTAL PROTEIN: 7.6 g/dL (ref 6.5–8.1)

## 2017-09-19 LAB — CBC
HCT: 38.2 % (ref 36.0–46.0)
Hemoglobin: 12.1 g/dL (ref 12.0–15.0)
MCH: 27.8 pg (ref 26.0–34.0)
MCHC: 31.7 g/dL (ref 30.0–36.0)
MCV: 87.6 fL (ref 78.0–100.0)
PLATELETS: 272 10*3/uL (ref 150–400)
RBC: 4.36 MIL/uL (ref 3.87–5.11)
RDW: 13.6 % (ref 11.5–15.5)
WBC: 5.8 10*3/uL (ref 4.0–10.5)

## 2017-09-19 LAB — URINALYSIS, ROUTINE W REFLEX MICROSCOPIC
BILIRUBIN URINE: NEGATIVE
Glucose, UA: NEGATIVE mg/dL
HGB URINE DIPSTICK: NEGATIVE
Ketones, ur: NEGATIVE mg/dL
Leukocytes, UA: NEGATIVE
NITRITE: NEGATIVE
PROTEIN: NEGATIVE mg/dL
Specific Gravity, Urine: 1.001 — ABNORMAL LOW (ref 1.005–1.030)
pH: 7 (ref 5.0–8.0)

## 2017-09-19 LAB — LIPASE, BLOOD: Lipase: 29 U/L (ref 11–51)

## 2017-09-19 MED ORDER — ONDANSETRON HCL 4 MG/2ML IJ SOLN
4.0000 mg | Freq: Once | INTRAMUSCULAR | Status: DC
  Filled 2017-09-19: qty 2

## 2017-09-19 MED ORDER — ONDANSETRON 4 MG PO TBDP
ORAL_TABLET | ORAL | Status: AC
  Administered 2017-09-19: 4 mg
  Filled 2017-09-19: qty 1

## 2017-09-19 MED ORDER — MORPHINE SULFATE (PF) 4 MG/ML IV SOLN
4.0000 mg | Freq: Once | INTRAVENOUS | Status: AC
  Administered 2017-09-19: 4 mg via INTRAVENOUS
  Filled 2017-09-19: qty 1

## 2017-09-19 MED ORDER — ONDANSETRON 4 MG PO TBDP
4.0000 mg | ORAL_TABLET | Freq: Once | ORAL | Status: AC | PRN
  Administered 2017-09-19: 4 mg via ORAL
  Filled 2017-09-19: qty 1

## 2017-09-19 NOTE — ED Provider Notes (Signed)
[facility] EMERGENCY DEPARTMENT Provider Note   CSN: [id number] Arrival date & time: 09/19/17  1300     History   Chief Complaint Chief Complaint  Patient presents with  . Emesis    HPI Stacy Andrews is a 56 y.o. female.  HPI  This is a 56 yo female with a history of Crohn's disease and bowel resection who presents with abdominal pain, nausea, vomiting, and diarrhea. Patient reports 3 day history of worsening bilateral flank pain. She reports that it is sharp and nonradiating. Current pain is 10 out of 10. She has not taken anything for the pain. She reports nonbilious, nonbloody emesis. She also reports nonbloody, watery diarrhea. She's never had pain like this before. She has not taken anything for her symptoms. Denies fevers or dysuria. Patient reports that she got her flu shot this week. No known sick contacts. She does report some sore throat.  Past Medical History:  Diagnosis Date  . Crohn's disease (HCC)   . Lactose intolerance   . Sinusitis     Patient Active Problem List   Diagnosis Date Noted  . ALLERGY UNSPECIFIED NOT ELSEWHERE CLASSIFIED 07/26/2010  . 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 CROHN'S DISEASE 09/21/2009  . BREAST PAIN, BILATERAL 09/21/2009    Past Surgical History:  Procedure Laterality Date  . ABDOMINAL HYSTERECTOMY    . BREAST REDUCTION SURGERY    . BREAST SURGERY      OB History    No data available       Home Medications    Prior to Admission medications   Medication Sig Start Date End Date Taking? Authorizing Provider  b complex vitamins tablet Take 1 tablet by mouth daily as needed (for supplementation).    Yes [provider]  famotidine (PEPCID) 20 MG tablet Take 1 tablet (20 mg total) by mouth 2 (two) times daily. Patient not taking: Reported on 09/19/2017 07/18/16   Vanetta Mulders, MD  HYDROcodone-acetaminophen (NORCO/VICODIN) 5-325 MG tablet Take 1-2 tablets by mouth every 6 (six) hours as needed  for moderate pain. Patient not taking: Reported on 09/19/2017 07/18/16   Vanetta Mulders, MD  predniSONE (DELTASONE) 10 MG tablet Take 4 tablets (40 mg total) by mouth daily. Patient not taking: Reported on 09/19/2017 07/18/16   Vanetta Mulders, MD    Family History No family history on file.  Social History Social History  Substance Use Topics  . Smoking status: Never Smoker  . Smokeless tobacco: Never Used  . Alcohol use No     Allergies   Gluten meal; Lactose intolerance (gi); Wheat bran; and Sulfa antibiotics   Review of Systems Review of Systems  Constitutional: Negative for fever.  HENT: Positive for sore throat.   Respiratory: Negative for shortness of breath.   Cardiovascular: Negative for chest pain.  Gastrointestinal: Positive for abdominal pain, diarrhea, nausea and vomiting.  Genitourinary: Negative for dysuria.  Neurological: Negative for headaches.  All other systems reviewed and are negative.    Physical Exam Updated Vital Signs BP (!) 149/84   Pulse (!) 58   Temp 98.8 F (37.1 C) (Oral)   Resp 16   Ht 5\' 4"  (1.626 m)   SpO2 95%   Physical Exam  Constitutional: She is oriented to person, place, and time. She appears well-developed and well-nourished. No distress.  HENT:  Head: Normocephalic and atraumatic.  Cardiovascular: Normal rate, regular rhythm and normal heart sounds.   Pulmonary/Chest: Effort normal. No respiratory distress. She has no wheezes.  Abdominal: Soft. Bowel sounds are normal. She exhibits no mass. There is tenderness. There is guarding. There is no rebound.  Diffuse mild tenderness to palpation with voluntary guarding, normal bowel sounds, midline well-healed abdominal scar  Neurological: She is alert and oriented to person, place, and time.  Skin: Skin is warm and dry.  Psychiatric: She has a normal mood and affect.  Nursing note and vitals reviewed.    ED Treatments / Results  Labs (all labs ordered are listed, but only  abnormal results are displayed) Labs Reviewed  COMPREHENSIVE METABOLIC PANEL - Abnormal; Notable for the following:       Result Value   BUN <5 (*)    Creatinine, Ser 1.05 (*)    ALT 11 (*)    GFR calc non Af Amer 59 (*)    All other components within normal limits  URINALYSIS, ROUTINE W REFLEX MICROSCOPIC - Abnormal; Notable for the following:    Color, Urine COLORLESS (*)    Specific Gravity, Urine 1.001 (*)    All other components within normal limits  LIPASE, BLOOD  CBC    EKG  EKG Interpretation  Date/Time:  Friday September 19 2017 20:37:46 EDT Ventricular Rate:  65 PR Interval:    QRS Duration: 108 QT Interval:  417 QTC Calculation: 434 R Axis:   40 Text Interpretation:  Sinus rhythm Abnormal R-wave progression, early transition Nonspecific T abnormalities, anterior leads No significant change was found Confirmed by Ross MarcusHorton, Kaida Games ([id number]) on 09/19/2017 11:27:40 PM       Radiology No results found.  Procedures Procedures (including critical care time)  Medications Ordered in ED Medications  ondansetron (ZOFRAN) injection 4 mg (not administered)  ondansetron (ZOFRAN-ODT) disintegrating tablet 4 mg (4 mg Oral Given 09/19/17 2232)  ondansetron (ZOFRAN-ODT) 4 MG disintegrating tablet (4 mg  Given 09/19/17 1327)  morphine 4 MG/ML injection 4 mg (4 mg Intravenous Given 09/19/17 2232)     Initial Impression / Assessment and Plan / ED Course  I have reviewed the triage vital signs and the nursing notes.  Pertinent labs & imaging results that were available during my care of the patient were reviewed by me and considered in my medical decision making (see chart for details).     Patient presents with nausea, vomiting, and diarrhea. Also with abdominal pain. She is nontoxic-appearing on exam. Vital signs reassuring. She is tender on exam. This could be related to viral illness; however, giving her tenderness, obstruction is a consideration as she has had a previous  obstruction and bowel resection. Lab work is largely reassuring. Unfortunately, unable to obtain IV access. This was after multiple attempts. Patient was given IM medication. Will obtain CT scan to rule out obstruction.  Final Clinical Impressions(s) / ED Diagnoses   Final diagnoses:  None    New Prescriptions New Prescriptions   No medications on file     Shon BatonHorton, Jaidon Sponsel F, MD 09/19/17 2359

## 2017-09-19 NOTE — ED Provider Notes (Signed)
H/o crohn's, here with abdominal pain, N, V, D. No fever.   IM meds, non-CM scan pending.  Plan: review CT, PO challenge. Anticipate discharge home.  CT scan shows no acute bowel obstruction. The patient appears well and comfortable on re-examination. She is tolerating PO fluids. She is comfortable with plan to discharge home. Return precautions discussed.    Elpidio AnisUpstill, Ahnesty Finfrock, PA-C 09/20/17 [id number]    Shon BatonHorton, Courtney F, MD 09/21/17 [phone]

## 2017-09-19 NOTE — ED Notes (Signed)
Patient transported to CT 

## 2017-09-19 NOTE — ED Notes (Signed)
EDP attempted US IV x 2 without success.

## 2017-09-19 NOTE — ED Notes (Signed)
Attempted to gain IV access, twice, but without success.

## 2017-09-20 MED ORDER — ONDANSETRON 4 MG PO TBDP: 4.0000 mg | ORAL_TABLET | Freq: Three times a day (TID) | ORAL | 0 refills | Status: DC | PRN

## 2017-09-20 NOTE — Discharge Instructions (Signed)
You can be discharged home tonight and should follow up with your doctor for recheck in 3-4 days if symptoms persist. Return to the emergency department with any worsening symptoms or new concerns.

## 2017-09-20 NOTE — ED Notes (Signed)
Pt departed in NAD.  

## 2018-08-25 ENCOUNTER — Emergency Department (HOSPITAL_COMMUNITY)
Admission: EM | Admit: 2018-08-25 | Discharge: 2018-08-25 | Disposition: A | Discharge status: Home / Self Care | Payer: BLUE CROSS/BLUE SHIELD | Attending: Emergency Medicine | Admitting: Emergency Medicine

## 2018-08-25 ENCOUNTER — Other Ambulatory Visit: Payer: Self-pay

## 2018-08-25 ENCOUNTER — Encounter (HOSPITAL_COMMUNITY): Payer: Self-pay

## 2018-08-25 ENCOUNTER — Emergency Department (HOSPITAL_COMMUNITY): Payer: BLUE CROSS/BLUE SHIELD

## 2018-08-25 DIAGNOSIS — J069 Acute upper respiratory infection, unspecified: Secondary | ICD-10-CM | POA: Diagnosis not present

## 2018-08-25 DIAGNOSIS — Z79899 Other long term (current) drug therapy: Secondary | ICD-10-CM | POA: Diagnosis not present

## 2018-08-25 DIAGNOSIS — B9789 Other viral agents as the cause of diseases classified elsewhere: Secondary | ICD-10-CM

## 2018-08-25 DIAGNOSIS — J029 Acute pharyngitis, unspecified: Secondary | ICD-10-CM | POA: Diagnosis present

## 2018-08-25 LAB — GROUP A STREP BY PCR: GROUP A STREP BY PCR: NOT DETECTED

## 2018-08-25 NOTE — ED Notes (Signed)
Patient transported to X-ray 

## 2018-08-25 NOTE — Discharge Instructions (Addendum)
Please return to the Emergency Department for any new or worsening symptoms or if your symptoms do not improve. Please be sure to follow up with your Primary Care Physician as soon as possible regarding your visit today. If you do not have a Primary Doctor please use the resources below to establish one. Your chest x-ray was negative for acute findings.  Your strep test was negative.  It is most likely that you are experiencing a viral illness at this time.  Antibiotics are not indicated to treat viral illnesses.  Please drink plenty of water and get plenty of rest and return to the emergency department for any new or worsening symptoms or if your symptoms do not improve in the next few days.  Contact a health care provider if: You are getting worse rather than better. Your symptoms are not controlled by medicine. You have chills. You have worsening shortness of breath. You have brown or red mucus. You have yellow or brown nasal discharge. You have pain in your face, especially when you bend forward. You have a fever. You have swollen neck glands. You have pain while swallowing. You have white areas in the back of your throat. Get help right away if: You have severe or persistent: Headache. Ear pain. Sinus pain. Chest pain. You have chronic lung disease and any of the following: Wheezing. Prolonged cough. Coughing up blood. A change in your usual mucus. You have a stiff neck. You have changes in your: Vision. Hearing. Thinking. Mood. Contact a health care provider if: You have new symptoms. You cough up pus. Your cough does not get better after 2-3 weeks, or your cough gets worse. You cannot control your cough with suppressant medicines and you are losing sleep. You develop pain that is getting worse or pain that is not controlled with pain medicines. You have a fever. You have unexplained weight loss. You have night sweats. Get help right away if: You cough up blood. You  have difficulty breathing. Your heartbeat is very fast.   RESOURCE GUIDE  Chronic Pain Problems: Contact [facility]  [phone] Patients need to be referred by their primary care doctor.  Insufficient Money for Medicine: Contact United Way:  call "211" or [facility] [phone].  No Primary Care Doctor: Call Health Connect  [phone] - can help you locate a primary care doctor that  accepts your insurance, provides certain services, etc. Physician Referral [phone]- [phone]  Agencies that provide inexpensive medical care: [facility]  [phone] [facility]  [phone] [facility]  [phone] [facility]  [phone] Planned Parenthood  [phone] [facility]  [phone]  Medicaid-accepting [facility] Providers: [facility]- [address], [address]  [phone], Mon-Fri 9am-7pm, Sat 9am-1pm [facility]- [address], [address] [place]  [phone] [facility]- [address], [address] [place]  [phone] [facility]- [address]  [phone] Renaye RakersVeita Bland- [address], [address], [phone]  Only accepts [place] Access [place] patients after they have their name  applied to their card  Self Pay (no insurance) in [facility] [place]: Sickle Cell Patients: Dr Willey BladeEric Dean, Ms [facility]  [address], [phone] [facility]- [address]  [phone]       Redge Gainer-     [facility]- [address], [address]       -  [facility]- see information above (Speak to [employer] if you do not have insurance)       -  [facility]- [address] [place], [phone]       -  [facility]- [address],  [phone]       -  [facility]- [address], [phone]       -  Dr Julio Sicks-  [address], [address], [place], [phone]        -  [facility]- [address] [phone]       -  [facility]- [address], [phone], also [address] [phone]       -    [facility] [address] [place], [phone], 1st & 3rd Saturday   every month, 10am-1pm  1) Find a Doctor and Pay Out of Pocket Although you won't have to find out who is covered by your insurance plan, it is a good idea to ask around and get recommendations. You will then need to call the office and see if the doctor you have chosen will accept you as a new patient and what types of options they offer for patients who are self-pay. Some doctors offer discounts or will set up payment plans for their patients who do not have insurance, but you will need to ask so you aren't surprised when you get to your appointment.  2) Contact Your Local Health Department Not all health departments have doctors that can see patients for sick visits, but many do, so it is worth a call to see if yours does. If you don't know where your local health department is, you can check in your phone book. The CDC also has a tool to help you locate your state's health department, and many state websites also have listings of all of their local health departments.  3) Find a Walk-in Clinic If your illness is not likely to be very severe or complicated, you may want to try a walk in clinic. These are popping up all over the country in pharmacies, drugstores, and shopping centers. They're usually staffed by nurse practitioners or physician assistants that have been trained to treat common illnesses and complaints. They're usually fairly quick and inexpensive. However, if you have serious medical issues or chronic medical problems, these are probably not your best option  STD Testing [facility] Department of [facility] [place], STD Clinic, [address] [place], phone [phone] or [phone].  Monday - Friday, call for an  appointment. [facility] Department of [employer], STD Clinic, [address], [place], phone [phone] or [phone].  Monday - Friday, call for an appointment.  Abuse/Neglect: [facility] Child Abuse Hotline [phone] [facility] Child Abuse Hotline 445-834-0629 (After Hours)  Emergency Shelter:  [facility] [phone]  Maternity Homes: [facility] at the [place] [facility] [phone] [facility] [phone]  MRSA Hotline #:   8256333987  [facility] Resources  [facility] [place]  United Way [facility] Dept. [address]                 [address]         [address] [place] [address]  [address]  Cristobal GoldmannW[place]worth                              [place] Phone:  [phone]                                  Phone:  [phone]                   Phone:  [phone]  [facility], [phone] [facility] - [facility]- [phone]       -     [facility] in [place], [address],                                  [phone], [facility]  [place] Child Abuse Hotline 2235373500(336) 770-265-6734 or [phone](336) [phone] (After Hours)   Behavioral Health Services  Substance Abuse Resources: [facility]  [phone] [facility] [phone] [facility] [phone] Floyd[facility] FlockDaymark [phone] Residential & Outpatient Substance Abuse Program  [phone]  Psychological Services: [facility]  [phone] [facility]  [phone] [facility], [phone] [place], [place], ACCESS LINE: [phone] or [phone], [email or web address]  Dental Assistance  If unable to pay or uninsured, contact:  [facility] or [facility]. to become qualified for the adult dental  clinic.  Patients with Medicaid: [facility] [phone], [phone] [address], [phone]  If unable to pay, or uninsured, contact [facility] [phone]) or [facility] [phone] in [place], [phone] in [facility]) to become qualified for the adult dental clinic   Other Low-Cost Community Dental Services: [facility]- [address], [place], [place], [phone], [phone], [address]. [address], 2nd and 4th Thursday of the month at 6:30am.  10 clients each day by appointment, can sometimes see walk-in patients if someone does not show for an appointment. [facility]- [address], [place], [place], [phone], [phone] [facility]- [address], [place], [place], [phone], [phone] [facility]- [phone] [facility]- [phone] [facility] [phone]

## 2018-08-25 NOTE — ED Provider Notes (Signed)
[facility] EMERGENCY DEPARTMENT Provider Note   CSN: [id number] Arrival date & time: 08/25/18  3295     History   Chief Complaint Chief Complaint  Patient presents with  . URI    HPI Stacy Andrews is a 57 y.o. female presenting for 1 week of runny nose, sore throat, congestion and mild occasionl cough.  Patient states that symptoms began gradually and worsened for the first 4 days plateauing over the last 2 days.  Patient states that yesterday she measured a fever of 100 at home.  Patient states that she has been taking ibuprofen with some relief of her symptoms.  Patient states that her cough has been mild and occasional/productive with white sputum over the last 3 days.  Patient denies chest pain, abdominal pain, nausea/vomiting, shortness of breath, leg swelling, or diarrhea.  HPI  Past Medical History:  Diagnosis Date  . Crohn's disease (HCC)   . Lactose intolerance   . Sinusitis     Patient Active Problem List   Diagnosis Date Noted  . ALLERGY UNSPECIFIED NOT ELSEWHERE CLASSIFIED 07/26/2010  . 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 CROHN'S DISEASE 09/21/2009  . BREAST PAIN, BILATERAL 09/21/2009    Past Surgical History:  Procedure Laterality Date  . ABDOMINAL HYSTERECTOMY    . BREAST REDUCTION SURGERY    . BREAST SURGERY       OB History   None      Home Medications    Prior to Admission medications   Medication Sig Start Date End Date Taking? Authorizing Provider  b complex vitamins tablet Take 1 tablet by mouth daily as needed (for supplementation).     [provider]  famotidine (PEPCID) 20 MG tablet Take 1 tablet (20 mg total) by mouth 2 (two) times daily. Patient not taking: Reported on 09/19/2017 07/18/16   Vanetta Mulders, MD  HYDROcodone-acetaminophen (NORCO/VICODIN) 5-325 MG tablet Take 1-2 tablets by mouth every 6 (six) hours as needed for moderate pain. Patient not taking: Reported on 09/19/2017 07/18/16   Vanetta Mulders, MD  ondansetron (ZOFRAN ODT) 4 MG disintegrating tablet Take 1 tablet (4 mg total) by mouth every 8 (eight) hours as needed for nausea or vomiting. 09/20/17   Elpidio Anis, PA-C  predniSONE (DELTASONE) 10 MG tablet Take 4 tablets (40 mg total) by mouth daily. Patient not taking: Reported on 09/19/2017 07/18/16   Vanetta Mulders, MD    Family History History reviewed. No pertinent family history.  Social History Social History   Tobacco Use  . Smoking status: Never Smoker  . Smokeless tobacco: Never Used  Substance Use Topics  . Alcohol use: No  . Drug use: No     Allergies   Gluten meal; Lactose intolerance (gi); Wheat bran; and Sulfa antibiotics   Review of Systems Review of Systems  Constitutional: Positive for fever. Negative for chills and fatigue.  HENT: Positive for congestion and sore throat. Negative for facial swelling, trouble swallowing and voice change.   Eyes: Negative.  Negative for visual disturbance.  Respiratory: Positive for cough. Negative for shortness of breath.   Cardiovascular: Negative.  Negative for chest pain and leg swelling.  Gastrointestinal: Negative.  Negative for abdominal pain, diarrhea, nausea and vomiting.  Genitourinary: Negative.  Negative for dysuria and hematuria.  Musculoskeletal: Negative.  Negative for arthralgias, myalgias and neck pain.  Skin: Negative.  Negative for color change and wound.  Neurological: Negative.  Negative for dizziness, weakness and headaches.     Physical Exam  Updated Vital Signs BP 127/86   Pulse (!) 58   Temp 97.8 F (36.6 C) (Oral)   Resp 16   SpO2 96%   Physical Exam  Constitutional: She is oriented to person, place, and time. She appears well-developed and well-nourished. No distress.  HENT:  Head: Normocephalic and atraumatic.  Right Ear: Tympanic membrane, external ear and ear canal normal.  Left Ear: Tympanic membrane, external ear and ear canal normal.  Nose: Rhinorrhea present.  Right sinus exhibits no maxillary sinus tenderness and no frontal sinus tenderness. Left sinus exhibits no maxillary sinus tenderness and no frontal sinus tenderness.  Mouth/Throat: Uvula is midline, oropharynx is clear and moist and mucous membranes are normal. No uvula swelling. No oropharyngeal exudate, posterior oropharyngeal edema, posterior oropharyngeal erythema or tonsillar abscesses. Tonsils are 1+ on the right. Tonsils are 1+ on the left. No tonsillar exudate.  Upper dental bridge in place  Eyes: Pupils are equal, round, and reactive to light. Conjunctivae and EOM are normal.  Neck: Trachea normal and normal range of motion. Neck supple. No tracheal deviation present.  Cardiovascular: Normal rate, regular rhythm and normal heart sounds.  Pulmonary/Chest: Effort normal and breath sounds normal. No respiratory distress. She has no decreased breath sounds. She has no wheezes.  Abdominal: Soft. There is no tenderness. There is no rebound and no guarding.  Musculoskeletal: Normal range of motion.  Neurological: She is alert and oriented to person, place, and time.  Skin: Skin is warm and dry.  Psychiatric: She has a normal mood and affect. Her behavior is normal.   ED Treatments / Results  Labs (all labs ordered are listed, but only abnormal results are displayed) Labs Reviewed  GROUP A STREP BY PCR    EKG None  Radiology Dg Chest 2 View  Result Date: 08/25/2018 CLINICAL DATA:  Cough, upper respiratory infection, chills over the last 9 days EXAM: CHEST - 2 VIEW COMPARISON:  Chest x-ray of 03/11/2016 FINDINGS: No active infiltrate or effusion is seen. Mediastinal and hilar contours are unremarkable. The heart is within normal limits in size. No bony abnormality is seen. IMPRESSION: No active cardiopulmonary disease. Electronically Signed   By: Dwyane DeePaul  Barry M.D.   On: 08/25/2018 10:52    Procedures Procedures (including critical care time)  Medications Ordered in ED Medications -  No data to display   Initial Impression / Assessment and Plan / ED Course  I have reviewed the triage vital signs and the nursing notes.  Pertinent labs & imaging results that were available during my care of the patient were reviewed by me and considered in my medical decision making (see chart for details).    Patient with symptoms consistent with URI for 7 days.  Patient's CXR is negative for acute infiltrate. Group A Strep PCR negative.  No maxillary or frontal sinus swelling or tenderness to palpation.  Symptoms are likely of viral etiology. Discussed that antibiotics are not indicated for viral infections. Patient will be discharged with symptomatic treatment. Patient verbalizes understanding and is agreeable with plan.  Patient is afebrile, not tachycardic, not hypotensive, well-appearing and in no acute distress.  Patient states that she has follow-up with her primary care provider and has been encouraged to do so.  Patient states that her cough is mild and occasional, does not wish to take cough medicine at this time.  At this time there does not appear to be any evidence of an acute emergency medical condition and the patient appears stable for discharge  with appropriate outpatient follow up. Diagnosis was discussed with patient who verbalizes understanding of care plan and is agreeable to discharge. I have discussed return precautions with patient who verbalizes understanding of return precautions. Patient strongly encouraged to follow-up with their PCP. All questions answered.    Note: Portions of this report may have been transcribed using voice recognition software. Every effort was made to ensure accuracy; however, inadvertent computerized transcription errors may still be present.  Final Clinical Impressions(s) / ED Diagnoses   Final diagnoses:  Viral URI with cough    ED Discharge Orders    None       Elizabeth Palau 08/25/18 1131    Mesner, Barbara Cower,  MD 08/25/18 1552

## 2018-08-25 NOTE — ED Triage Notes (Signed)
Pt states she has had sore throat, facial pain, cough, congestion X1 week. Pt also reports running fevers at home. Afebrile here.

## 2018-08-25 NOTE — ED Notes (Signed)
ED Provider at bedside. 

## 2019-11-18 ENCOUNTER — Encounter (HOSPITAL_BASED_OUTPATIENT_CLINIC_OR_DEPARTMENT_OTHER): Payer: Self-pay | Admitting: *Deleted

## 2019-11-18 ENCOUNTER — Emergency Department (HOSPITAL_BASED_OUTPATIENT_CLINIC_OR_DEPARTMENT_OTHER): Payer: BC Managed Care – PPO

## 2019-11-18 ENCOUNTER — Other Ambulatory Visit: Payer: Self-pay

## 2019-11-18 ENCOUNTER — Emergency Department (HOSPITAL_BASED_OUTPATIENT_CLINIC_OR_DEPARTMENT_OTHER)
Admission: EM | Admit: 2019-11-18 | Discharge: 2019-11-18 | Disposition: A | Discharge status: Home / Self Care | Payer: BC Managed Care – PPO | Attending: Emergency Medicine | Admitting: Emergency Medicine

## 2019-11-18 DIAGNOSIS — Y9389 Activity, other specified: Secondary | ICD-10-CM | POA: Insufficient documentation

## 2019-11-18 DIAGNOSIS — Y999 Unspecified external cause status: Secondary | ICD-10-CM | POA: Insufficient documentation

## 2019-11-18 DIAGNOSIS — I10 Essential (primary) hypertension: Secondary | ICD-10-CM | POA: Diagnosis not present

## 2019-11-18 DIAGNOSIS — R52 Pain, unspecified: Secondary | ICD-10-CM

## 2019-11-18 DIAGNOSIS — S0990XA Unspecified injury of head, initial encounter: Secondary | ICD-10-CM | POA: Diagnosis present

## 2019-11-18 DIAGNOSIS — Z79899 Other long term (current) drug therapy: Secondary | ICD-10-CM | POA: Insufficient documentation

## 2019-11-18 DIAGNOSIS — Y92410 Unspecified street and highway as the place of occurrence of the external cause: Secondary | ICD-10-CM | POA: Diagnosis not present

## 2019-11-18 DIAGNOSIS — M542 Cervicalgia: Secondary | ICD-10-CM | POA: Diagnosis not present

## 2019-11-18 MED ORDER — METHOCARBAMOL 750 MG PO TABS: 750.0000 mg | ORAL_TABLET | Freq: Every evening | ORAL | 0 refills | Status: AC | PRN

## 2019-11-18 MED ORDER — ACETAMINOPHEN 325 MG PO TABS
650.0000 mg | ORAL_TABLET | Freq: Once | ORAL | Status: AC
  Administered 2019-11-18: 13:00:00 650 mg via ORAL
  Filled 2019-11-18: qty 2
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
## 2019-11-18 NOTE — Discharge Instructions (Addendum)
You may takeTylenol as needed for pain control. You may take [phone] mg of Tylenol every 6 hours. Do not exceed 4000 mg of Tylenol daily as this can lead to liver damage. You may use warm and cold compresses to help with your symptoms.   You were given a prescription for Robaxin which is a muscle relaxer.  You should not drive, work, or operate machinery while taking this medication as it can make you very drowsy.    You were noted to have high blood pressure in the emergency room.Please follow up with your primary care provider within 5-7 days for re-evaluation of this and of your other symptoms. If you do not have a primary care provider, information for a healthcare clinic has been provided for you to make arrangements for follow up care. Please return to the emergency department for any new or worsening symptoms.

## 2020-01-08 ENCOUNTER — Emergency Department (HOSPITAL_BASED_OUTPATIENT_CLINIC_OR_DEPARTMENT_OTHER): Payer: BC Managed Care – PPO

## 2020-01-08 ENCOUNTER — Emergency Department (HOSPITAL_BASED_OUTPATIENT_CLINIC_OR_DEPARTMENT_OTHER)
Admission: EM | Admit: 2020-01-08 | Discharge: 2020-01-08 | Disposition: A | Discharge status: Home / Self Care | Payer: BC Managed Care – PPO | Attending: Emergency Medicine | Admitting: Emergency Medicine

## 2020-01-08 ENCOUNTER — Other Ambulatory Visit: Payer: Self-pay

## 2020-01-08 ENCOUNTER — Encounter (HOSPITAL_BASED_OUTPATIENT_CLINIC_OR_DEPARTMENT_OTHER): Payer: Self-pay

## 2020-01-08 DIAGNOSIS — Y9289 Other specified places as the place of occurrence of the external cause: Secondary | ICD-10-CM | POA: Diagnosis not present

## 2020-01-08 DIAGNOSIS — Z79899 Other long term (current) drug therapy: Secondary | ICD-10-CM | POA: Insufficient documentation

## 2020-01-08 DIAGNOSIS — Y9389 Activity, other specified: Secondary | ICD-10-CM | POA: Insufficient documentation

## 2020-01-08 DIAGNOSIS — Y999 Unspecified external cause status: Secondary | ICD-10-CM | POA: Insufficient documentation

## 2020-01-08 DIAGNOSIS — S46812A Strain of other muscles, fascia and tendons at shoulder and upper arm level, left arm, initial encounter: Secondary | ICD-10-CM | POA: Diagnosis not present

## 2020-01-08 DIAGNOSIS — S46912A Strain of unspecified muscle, fascia and tendon at shoulder and upper arm level, left arm, initial encounter: Secondary | ICD-10-CM

## 2020-01-08 DIAGNOSIS — S4992XA Unspecified injury of left shoulder and upper arm, initial encounter: Secondary | ICD-10-CM | POA: Diagnosis present

## 2020-01-08 MED ORDER — METHOCARBAMOL 750 MG PO TABS
750.0000 mg | ORAL_TABLET | Freq: Two times a day (BID) | ORAL | 0 refills | Status: AC | PRN
Start: 2020-01-08 — End: ?

## 2020-01-08 MED ORDER — METHOCARBAMOL 500 MG PO TABS: 500.0000 mg | ORAL_TABLET | Freq: Two times a day (BID) | ORAL | 0 refills | Status: DC

## 2020-01-08 MED ORDER — KETOROLAC TROMETHAMINE 30 MG/ML IJ SOLN
30.0000 mg | Freq: Once | INTRAMUSCULAR | Status: AC
  Administered 2020-01-08: 30 mg via INTRAMUSCULAR
  Filled 2020-01-08: qty 1

## 2020-01-08 NOTE — ED Notes (Signed)
Pt. returned from XR. 
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
## 2020-01-08 NOTE — ED Notes (Signed)
Pt verbalized understanding of dc instructions.

## 2021-08-24 IMAGING — CR DG THORACIC SPINE 3V
3 series · 3 of 3 positions shown · non-contrast
Comparison: none

CLINICAL DATA: Neck and head pain.

EXAM:
THORACIC SPINE - 3 VIEWS

[w t-spine a.p. *]
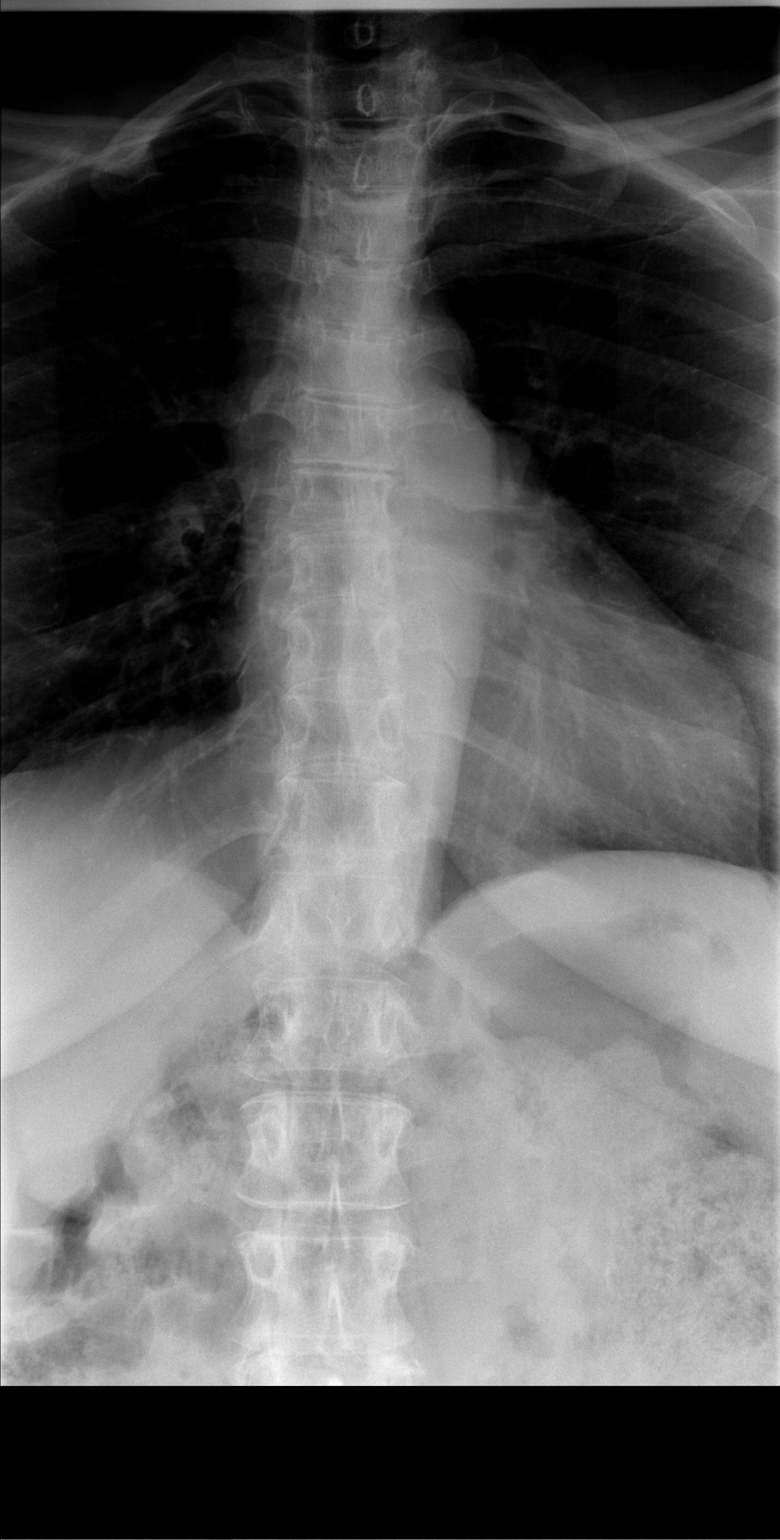

[w t-spine lat *]
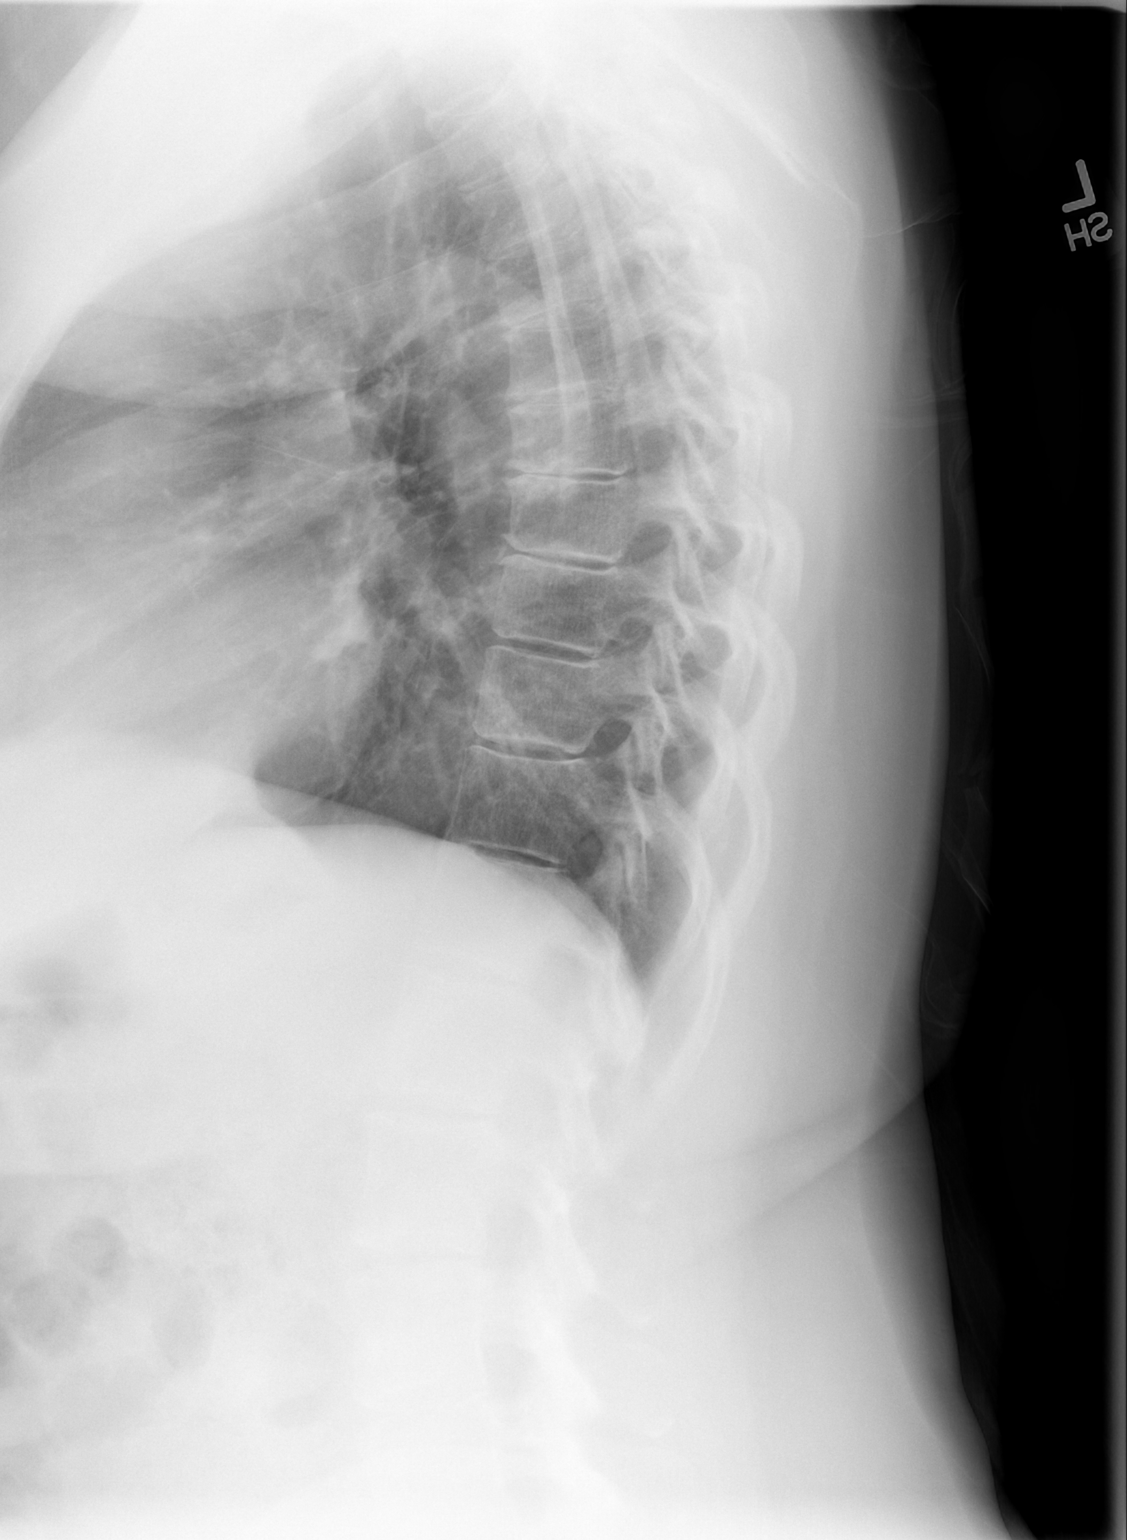

[w swimmers view]
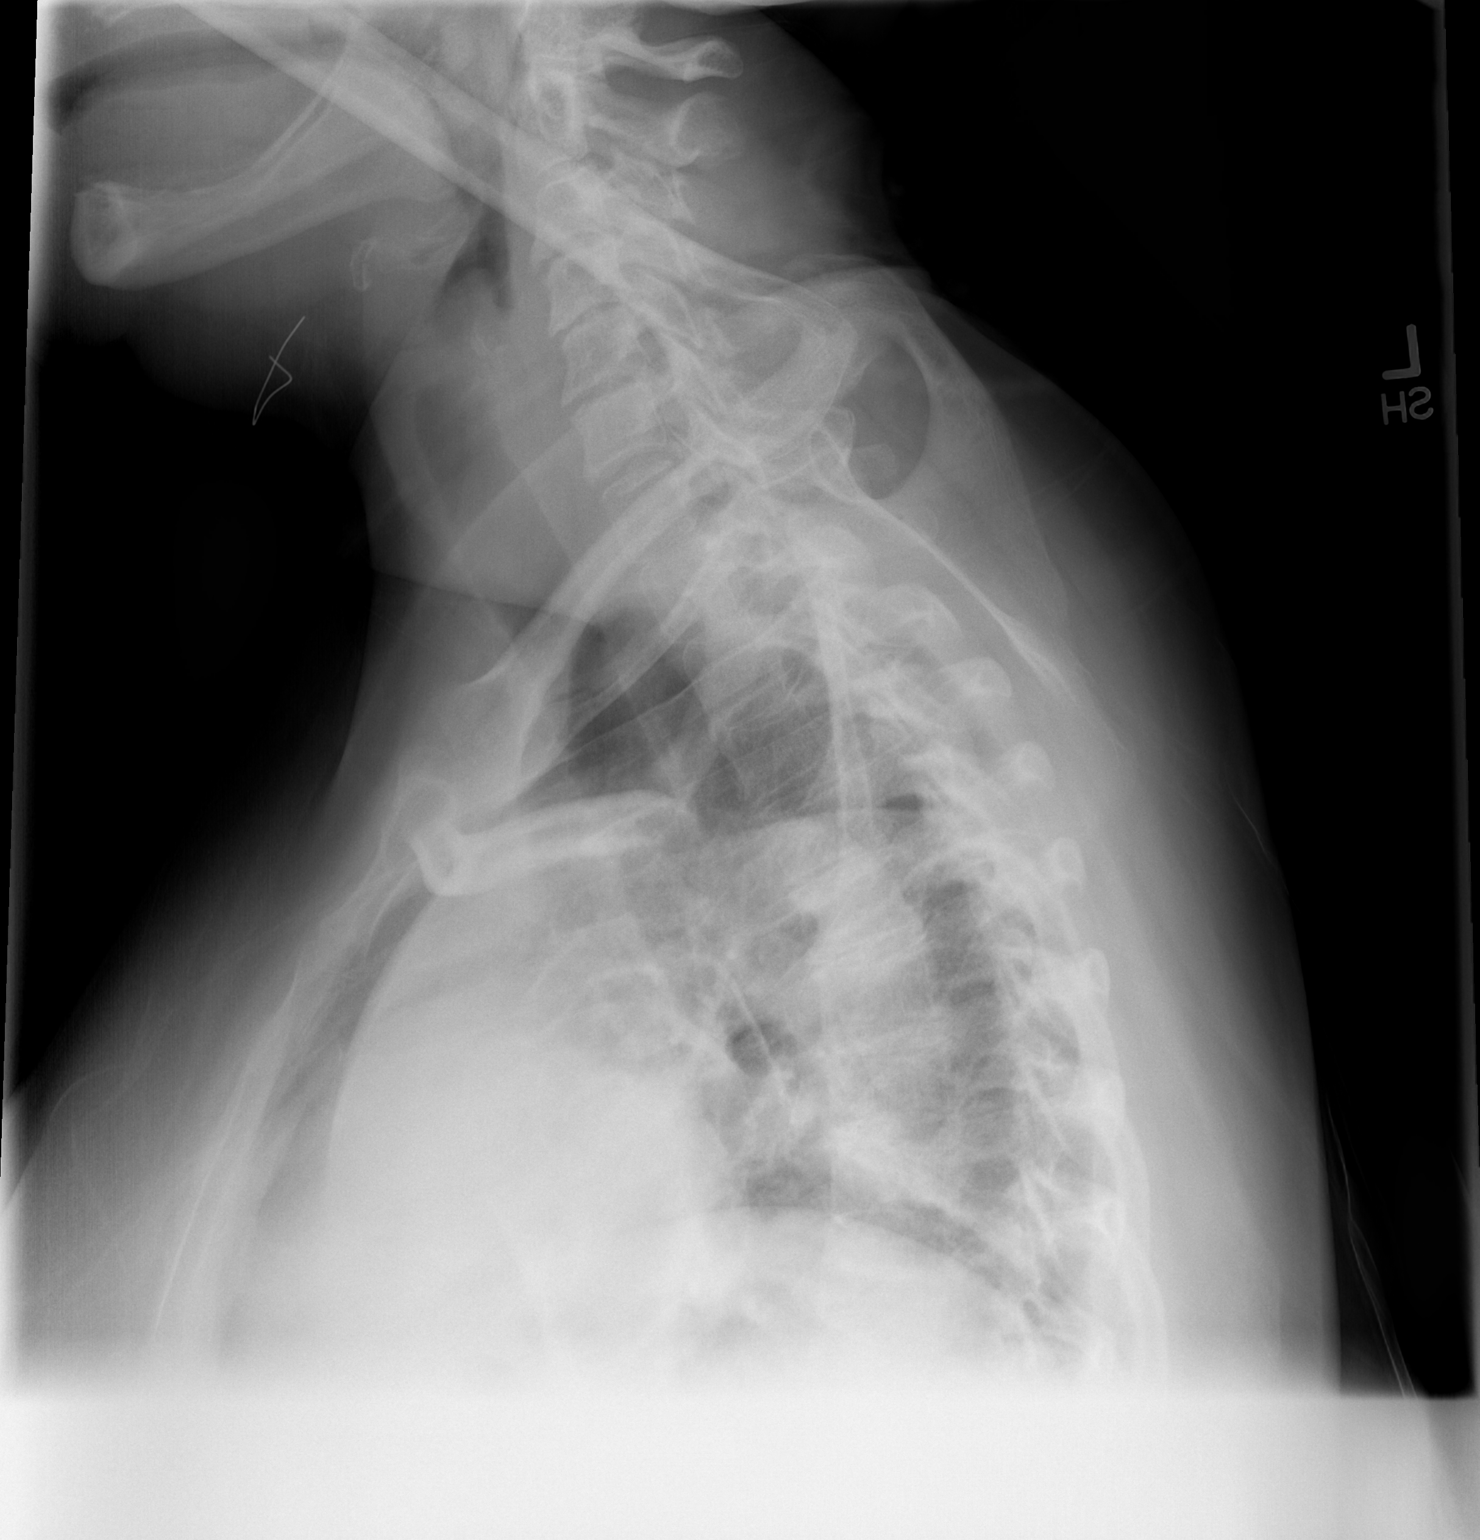

[3 of 3 positions shown; findings below may reference images not displayed]

FINDINGS: Thoracolumbar spine scoliosis and degenerative change. No acute bony
abnormality. No evidence of fracture.
IMPRESSION: Thoracolumbar spine scoliosis and degenerative change. No acute
abnormality.
